# Patient Record
Sex: Male | Born: 1960 | Race: White | Hispanic: No | Marital: Married | Smoking: Current every day smoker
Health system: Southern US, Community
[De-identification: ages and names within clinical notes are randomized; demographics above are authoritative.]

## PROBLEM LIST (undated history)

## (undated) DIAGNOSIS — M1990 Unspecified osteoarthritis, unspecified site: Secondary | ICD-10-CM

## (undated) DIAGNOSIS — L409 Psoriasis, unspecified: Secondary | ICD-10-CM

## (undated) DIAGNOSIS — K08119 Complete loss of teeth due to trauma, unspecified class: Secondary | ICD-10-CM

## (undated) DIAGNOSIS — E222 Syndrome of inappropriate secretion of antidiuretic hormone: Secondary | ICD-10-CM

## (undated) DIAGNOSIS — T754XXA Electrocution, initial encounter: Secondary | ICD-10-CM

## (undated) HISTORY — PX: BACK SURGERY: SHX140

---

## 2000-05-04 ENCOUNTER — Emergency Department (HOSPITAL_COMMUNITY): Admission: EM | Admit: 2000-05-04 | Discharge: 2000-05-04 | Payer: Self-pay | Admitting: Emergency Medicine

## 2000-05-04 ENCOUNTER — Encounter: Payer: Self-pay | Admitting: Emergency Medicine

## 2000-10-22 ENCOUNTER — Emergency Department (HOSPITAL_COMMUNITY): Admission: EM | Admit: 2000-10-22 | Discharge: 2000-10-22 | Payer: Self-pay | Admitting: Emergency Medicine

## 2000-10-22 ENCOUNTER — Encounter: Payer: Self-pay | Admitting: Emergency Medicine

## 2000-11-03 ENCOUNTER — Emergency Department (HOSPITAL_COMMUNITY): Admission: EM | Admit: 2000-11-03 | Discharge: 2000-11-03 | Payer: Self-pay | Admitting: Emergency Medicine

## 2001-08-27 ENCOUNTER — Emergency Department (HOSPITAL_COMMUNITY): Admission: EM | Admit: 2001-08-27 | Discharge: 2001-08-28 | Payer: Self-pay | Admitting: Emergency Medicine

## 2003-06-25 ENCOUNTER — Emergency Department (HOSPITAL_COMMUNITY): Admission: EM | Admit: 2003-06-25 | Discharge: 2003-06-25 | Payer: Self-pay | Admitting: Emergency Medicine

## 2003-10-25 ENCOUNTER — Emergency Department (HOSPITAL_COMMUNITY): Admission: EM | Admit: 2003-10-25 | Discharge: 2003-10-25 | Payer: Self-pay | Admitting: *Deleted

## 2004-07-04 ENCOUNTER — Emergency Department (HOSPITAL_COMMUNITY): Admission: EM | Admit: 2004-07-04 | Discharge: 2004-07-04 | Payer: Self-pay | Admitting: Emergency Medicine

## 2004-10-15 ENCOUNTER — Emergency Department (HOSPITAL_COMMUNITY): Admission: EM | Admit: 2004-10-15 | Discharge: 2004-10-15 | Payer: Self-pay | Admitting: Emergency Medicine

## 2004-10-16 ENCOUNTER — Emergency Department (HOSPITAL_COMMUNITY): Admission: EM | Admit: 2004-10-16 | Discharge: 2004-10-17 | Payer: Self-pay | Admitting: Emergency Medicine

## 2004-10-25 ENCOUNTER — Emergency Department (HOSPITAL_COMMUNITY): Admission: EM | Admit: 2004-10-25 | Discharge: 2004-10-25 | Payer: Self-pay | Admitting: Emergency Medicine

## 2005-02-05 ENCOUNTER — Emergency Department (HOSPITAL_COMMUNITY): Admission: EM | Admit: 2005-02-05 | Discharge: 2005-02-05 | Payer: Self-pay | Admitting: Emergency Medicine

## 2008-08-12 ENCOUNTER — Ambulatory Visit (HOSPITAL_COMMUNITY): Admission: RE | Admit: 2008-08-12 | Discharge: 2008-08-12 | Payer: Self-pay | Admitting: Family Medicine

## 2010-02-10 ENCOUNTER — Encounter: Payer: Self-pay | Admitting: Orthopaedic Surgery

## 2010-10-16 ENCOUNTER — Other Ambulatory Visit: Payer: Self-pay

## 2010-10-16 ENCOUNTER — Telehealth: Payer: Self-pay

## 2010-10-16 DIAGNOSIS — Z139 Encounter for screening, unspecified: Secondary | ICD-10-CM

## 2010-10-16 NOTE — Telephone Encounter (Signed)
Rx and instructions mailed to pt.  

## 2010-10-16 NOTE — Telephone Encounter (Signed)
OK for colonoscopy.  

## 2010-10-16 NOTE — Telephone Encounter (Signed)
Gastroenterology Pre-Procedure Form  Request Date: 10/15/2010       Requesting Physician: Dr. Delbert Harness     PATIENT INFORMATION:  Colin Ellis is a 50 y.o., male (DOB=[birth year]).  PROCEDURE: Procedure(s) requested: colonoscopy Procedure Reason: screening for colon cancer  PATIENT REVIEW QUESTIONS: The patient reports the following:   1. Diabetes Melitis: no 2. Joint replacements in the past 12 months: no 3. Major health problems in the past 3 months: no 4. Has an artificial valve or MVP:no 5. Has been advised in past to take antibiotics in advance of a procedure like teeth cleaning: no}    MEDICATIONS & ALLERGIES:    Patient reports the following regarding taking any blood thinners:   Plavix? no Aspirin?no Coumadin?  no  Patient confirms/reports the following medications:  Current Outpatient Prescriptions  Medication Sig Dispense Refill  . amphetamine-dextroamphetamine (ADDERALL) 20 MG tablet Take 20 mg by mouth daily. 1/2 tablet three times daily       . naproxen sodium (ANAPROX) 220 MG tablet Take 220 mg by mouth 2 (two) times daily with a meal.        . UNKNOWN TO PATIENT Ointment for psoriasis as directed         Patient confirms/reports the following allergies:  Allergies  Allergen Reactions  . Sulfa Antibiotics     Upset stomach    Patient is appropriate to schedule for requested procedure(s): Yes  AUTHORIZATION INFORMATION Primary Insurance:   ID #:   Group #:  Pre-Cert / Auth required:  Pre-Cert / Auth #:   Secondary Insurance:   ID #:   Group #:  Pre-Cert / Auth required:  Pre-Cert / Auth #:   No orders of the defined types were placed in this encounter.    SCHEDULE INFORMATION: Procedure has been scheduled as follows:  Date: 11/12/2010     Time: 7:30 AM  Location: [facility] Short Stay   This Gastroenterology Pre-Precedure Form is being routed to the following provider(s) for review: R. Roetta Sessions, MD

## 2010-11-09 MED ORDER — SODIUM CHLORIDE 0.45 % IV SOLN
Freq: Once | INTRAVENOUS | Status: AC
  Administered 2010-11-12: 07:00:00 via INTRAVENOUS

## 2010-11-12 ENCOUNTER — Encounter (HOSPITAL_COMMUNITY)
Admission: RE | Disposition: A | Discharge status: Home / Self Care | Payer: Self-pay | Source: Ambulatory Visit | Attending: Internal Medicine

## 2010-11-12 ENCOUNTER — Encounter (HOSPITAL_COMMUNITY): Payer: Self-pay

## 2010-11-12 ENCOUNTER — Ambulatory Visit (HOSPITAL_COMMUNITY)
Admission: RE | Admit: 2010-11-12 | Discharge: 2010-11-12 | Disposition: A | Discharge status: Home / Self Care | Payer: BC Managed Care – PPO | Source: Ambulatory Visit | Attending: Internal Medicine | Admitting: Internal Medicine

## 2010-11-12 DIAGNOSIS — K5730 Diverticulosis of large intestine without perforation or abscess without bleeding: Secondary | ICD-10-CM | POA: Insufficient documentation

## 2010-11-12 DIAGNOSIS — Z1211 Encounter for screening for malignant neoplasm of colon: Secondary | ICD-10-CM

## 2010-11-12 DIAGNOSIS — Z139 Encounter for screening, unspecified: Secondary | ICD-10-CM

## 2010-11-12 HISTORY — DX: Syndrome of inappropriate secretion of antidiuretic hormone: E22.2

## 2010-11-12 HISTORY — PX: COLONOSCOPY: SHX5424

## 2010-11-12 SURGERY — COLONOSCOPY
Anesthesia: Moderate Sedation

## 2010-11-12 MED ORDER — STERILE WATER FOR IRRIGATION IR SOLN
Status: DC | PRN
  Administered 2010-11-12: 08:00:00

## 2010-11-12 MED ORDER — MEPERIDINE HCL 100 MG/ML IJ SOLN
INTRAMUSCULAR | Status: AC
  Filled 2010-11-12: qty 2

## 2010-11-12 MED ORDER — MEPERIDINE HCL 100 MG/ML IJ SOLN
INTRAMUSCULAR | Status: DC | PRN
  Administered 2010-11-12: 50 mg
  Administered 2010-11-12 (×3): 25 mg

## 2010-11-12 MED ORDER — MIDAZOLAM HCL 5 MG/5ML IJ SOLN
INTRAMUSCULAR | Status: DC | PRN
  Administered 2010-11-12 (×2): 2 mg via INTRAVENOUS
  Administered 2010-11-12 (×2): 1 mg via INTRAVENOUS

## 2010-11-12 MED ORDER — MIDAZOLAM HCL 5 MG/5ML IJ SOLN
INTRAMUSCULAR | Status: AC
  Filled 2010-11-12: qty 10

## 2010-11-12 NOTE — H&P (Signed)
  Primary Care Physician:  Isabella Stalling, MD Primary Gastroenterologist:  Dr.   Pre-Procedure History & Physical: HPI:  Colin Ellis is a 50 y.o. male is here for a screening colonoscopy. First-ever average risk screening examination. No bowel symptoms. No family history of colon cancer or colon polyps.  Past Medical History  Diagnosis Date  . ADH disorder     History reviewed. No pertinent past surgical history.  Prior to Admission medications   Medication Sig Start Date End Date Taking? Authorizing Provider  amphetamine-dextroamphetamine (ADDERALL) 20 MG tablet Take 10 mg by mouth 3 (three) times daily.    Yes Historical Provider, MD  halobetasol (ULTRAVATE) 0.05 % cream Apply 1 application topically daily. Psoriasis Outbreak    Yes Historical Provider, MD  naproxen sodium (ALEVE) 220 MG tablet Take 220 mg by mouth 2 (two) times daily as needed. Pain    Yes Historical Provider, MD  naproxen sodium (ANAPROX) 220 MG tablet Take 220 mg by mouth 2 (two) times daily with a meal.      Historical Provider, MD  UNKNOWN TO PATIENT Ointment for psoriasis as directed     Historical Provider, MD    Allergies as of 10/16/2010 - Review Complete 10/16/2010  Allergen Reaction Noted  . Sulfa antibiotics  10/16/2010    Family History  Problem Relation Age of Onset  . Anesthesia problems Neg Hx   . Hypotension Neg Hx   . Malignant hyperthermia Neg Hx   . Pseudochol deficiency Neg Hx     History   Social History  . Marital Status: Single    Spouse Name: N/A    Number of Children: N/A  . Years of Education: N/A   Occupational History  . Not on file.   Social History Main Topics  . Smoking status: Current Everyday Smoker -- 1.0 packs/day for 30 years    Types: Cigarettes  . Smokeless tobacco: Not on file  . Alcohol Use: 2.4 oz/week    4 Cans of beer per week  . Drug Use: No  . Sexually Active:    Other Topics Concern  . Not on file   Social History Narrative  . No  narrative on file    Review of Systems: See HPI, otherwise negative ROS  Physical Exam: BP 151/87  Pulse 63  Temp(Src) 98.6 F (37 C) (Oral)  Resp 12  Ht 6' (1.829 m)  Wt 160 lb (72.576 kg)  BMI 21.70 kg/m2  SpO2 100% General:   Alert,  Well-developed, well-nourished, pleasant and cooperative in NAD Head:  Normocephalic and atraumatic. Eyes:  Sclera clear, no icterus.   Conjunctiva pink. Nose:  No deformity, discharge,  or lesions. Mouth:  No deformity or lesions, dentition normal. Neck:  Supple; no masses or thyromegaly. Lungs:  Clear throughout to auscultation.   No wheezes, crackles, or rhonchi. No acute distress. Heart:  Regular rate and rhythm; no murmurs, clicks, rubs,  or gallops. Abdomen:  Soft, nontender and nondistended. No masses, hepatosplenomegaly or hernias noted. Normal bowel sounds, without guarding, and without rebound.   Msk:  Symmetrical without gross deformities. Normal posture. Pulses:  Normal pulses noted. Extremities:  Without clubbing or edema.  Impression/Plan: Colin Ellis is now here to undergo a screening colonoscopy.  First-ever average risk screening examination  Risks, benefits, limitations, imponderables and alternatives regarding colonoscopy have been reviewed with the patient. Questions have been answered. All parties agreeable.

## 2010-11-16 ENCOUNTER — Encounter (HOSPITAL_COMMUNITY): Payer: Self-pay | Admitting: Internal Medicine

## 2011-09-12 ENCOUNTER — Emergency Department (HOSPITAL_COMMUNITY)
Admission: EM | Admit: 2011-09-12 | Discharge: 2011-09-13 | Disposition: A | Discharge status: Home / Self Care | Payer: BC Managed Care – PPO | Attending: Emergency Medicine | Admitting: Emergency Medicine

## 2011-09-12 ENCOUNTER — Encounter (HOSPITAL_COMMUNITY): Payer: Self-pay | Admitting: *Deleted

## 2011-09-12 DIAGNOSIS — R209 Unspecified disturbances of skin sensation: Secondary | ICD-10-CM | POA: Insufficient documentation

## 2011-09-12 DIAGNOSIS — H538 Other visual disturbances: Secondary | ICD-10-CM | POA: Insufficient documentation

## 2011-09-12 DIAGNOSIS — Z79899 Other long term (current) drug therapy: Secondary | ICD-10-CM | POA: Insufficient documentation

## 2011-09-12 DIAGNOSIS — F17200 Nicotine dependence, unspecified, uncomplicated: Secondary | ICD-10-CM | POA: Insufficient documentation

## 2011-09-12 DIAGNOSIS — M542 Cervicalgia: Secondary | ICD-10-CM

## 2011-09-12 DIAGNOSIS — F988 Other specified behavioral and emotional disorders with onset usually occurring in childhood and adolescence: Secondary | ICD-10-CM | POA: Insufficient documentation

## 2011-09-12 NOTE — ED Provider Notes (Addendum)
History   This chart was scribed for Colin Lennert, MD by Sofie Rower. The patient was seen in room APA01/APA01 and the patient's care was started at 11:59 PM     CSN: [id number]  Arrival date & time 09/12/11  2244   First MD Initiated Contact with Patient 09/12/11 2355      Chief Complaint  Patient presents with  . Neck Pain  . Blurred Vision    (Consider location/radiation/quality/duration/timing/severity/associated sxs/prior treatment) Patient is a 51 y.o. male presenting with neck pain. The history is provided by the patient. No language interpreter was used.  Neck Pain  This is a new problem. The current episode started 1 to 2 hours ago. The problem occurs rarely. The problem has been gradually worsening. The pain is associated with an unknown factor. There has been no fever. The pain is present in the left side. The quality of the pain is described as aching. The pain is moderate. The symptoms are aggravated by position. The pain is the same all the time. Associated symptoms include visual change and numbness (left side of face.). Pertinent negatives include no chest pain, no headaches and no tingling. He has tried nothing for the symptoms. The treatment provided no relief.    Colin Ellis is a 51 y.o. male who presents to the Emergency Department complaining of sudden, progressively worsening, neck pain, located at the left side of the neck onset today (11:00PM) with associated symptoms of blurred vision and numbness located at the left side of the face. The pt describes the neck pain as a throbbing pain which comes and goes intermittently. Modifying factors include bending the neck into certain positions which intensifies the neck pain.  The pt is a current everyday smoker and drinks alcohol.   PCP is Dr. Janna Arch.    Past Medical History  Diagnosis Date  . ADH disorder     Past Surgical History  Procedure Date  . Colonoscopy 11/12/2010    Procedure: COLONOSCOPY;   Surgeon: Corbin Ade, MD;  Location: [facility];  Service: Endoscopy;  Laterality: N/A;  7:30 AM    Family History  Problem Relation Age of Onset  . Anesthesia problems Neg Hx   . Hypotension Neg Hx   . Malignant hyperthermia Neg Hx   . Pseudochol deficiency Neg Hx     History  Substance Use Topics  . Smoking status: Current Everyday Smoker -- 1.0 packs/day for 30 years    Types: Cigarettes  . Smokeless tobacco: Not on file  . Alcohol Use: 2.4 oz/week    4 Cans of beer per week      Review of Systems  Constitutional: Negative for fatigue.  HENT: Positive for neck pain. Negative for congestion, sinus pressure and ear discharge.   Eyes: Negative for discharge.  Respiratory: Negative for cough.   Cardiovascular: Negative for chest pain.  Gastrointestinal: Negative for abdominal pain and diarrhea.  Genitourinary: Negative for frequency and hematuria.  Musculoskeletal: Negative for back pain.  Skin: Negative for rash.  Neurological: Positive for numbness (left side of face.). Negative for tingling, seizures and headaches.  Hematological: Negative.   Psychiatric/Behavioral: Negative for hallucinations.    Allergies  Sulfa antibiotics  Home Medications   Current Outpatient Rx  Name Route Sig Dispense Refill  . AMPHETAMINE-DEXTROAMPHETAMINE 20 MG PO TABS Oral Take 10 mg by mouth 3 (three) times daily.     Marland Kitchen HALOBETASOL PROPIONATE 0.05 % EX CREA Topical Apply 1 application topically daily. Psoriasis  Outbreak     . NAPROXEN SODIUM 220 MG PO TABS Oral Take 660 mg by mouth daily. May take two additional tablets at bedtime if needed      BP 143/87  Pulse 93  Temp 98.4 F (36.9 C) (Oral)  Resp 20  Ht 6' (1.829 m)  Wt 145 lb (65.772 kg)  BMI 19.67 kg/m2  SpO2 97%  Physical Exam  Nursing note and vitals reviewed. Constitutional: He is oriented to person, place, and time. He appears well-developed.  HENT:  Head: Normocephalic and atraumatic.  Eyes: Conjunctivae  and EOM are normal. No scleral icterus.  Neck: Neck supple. No thyromegaly present.       Minor left lateral neck tenderness.   Cardiovascular: Normal rate and regular rhythm.  Exam reveals no gallop and no friction rub.   No murmur heard. Pulmonary/Chest: No stridor. He has no wheezes. He has no rales. He exhibits no tenderness.  Abdominal: He exhibits no distension. There is no tenderness. There is no rebound.  Musculoskeletal: Normal range of motion. He exhibits no edema.  Lymphadenopathy:    He has no cervical adenopathy.  Neurological: He is oriented to person, place, and time. Coordination normal.  Skin: No rash noted. No erythema.  Psychiatric: He has a normal mood and affect. His behavior is normal.    ED Course  Procedures (including critical care time)  DIAGNOSTIC STUDIES: Oxygen Saturation is 97% on room air , normal by my interpretation.    COORDINATION OF CARE:    12:01AM- X-ray discussed. Patient agrees with treatment.   Labs Reviewed - No data to display No results found.   No diagnosis found.    MDM       Date: 10/19/2011  Rate:76  Rhythm: normal sinus rhythm  QRS Axis: normal  Intervals: normal  ST/T Wave abnormalities: normal  Conduction Disutrbances:right bundle branch block  Narrative Interpretation:   Old EKG Reviewed: none available    The chart was scribed for me under my direct supervision.  I personally performed the history, physical, and medical decision making and all procedures in the evaluation of this patient.Colin Lennert, MD 09/13/11 1610  Colin Lennert, MD 10/19/11 1314

## 2011-09-12 NOTE — ED Notes (Signed)
Pt states sharp pain in side of L neck; states it feels like it is pulsating

## 2011-09-12 NOTE — ED Notes (Signed)
Pt with sudden left sided neck pain and c/o blurry vision to both eyes, left side of face "feels funny"

## 2011-09-13 ENCOUNTER — Emergency Department (HOSPITAL_COMMUNITY): Payer: BC Managed Care – PPO

## 2011-09-13 LAB — CBC WITH DIFFERENTIAL/PLATELET
Basophils Relative: 1 % (ref 0–1)
Eosinophils Absolute: 0.1 10*3/uL (ref 0.0–0.7)
Eosinophils Relative: 1 % (ref 0–5)
HCT: 40.8 % (ref 39.0–52.0)
Hemoglobin: 14.3 g/dL (ref 13.0–17.0)
Lymphs Abs: 1.1 10*3/uL (ref 0.7–4.0)
MCH: 33.3 pg (ref 26.0–34.0)
MCHC: 35 g/dL (ref 30.0–36.0)
MCV: 95.1 fL (ref 78.0–100.0)
Monocytes Absolute: 0.7 10*3/uL (ref 0.1–1.0)
Monocytes Relative: 11 % (ref 3–12)
Neutrophils Relative %: 71 % (ref 43–77)
RBC: 4.29 MIL/uL (ref 4.22–5.81)

## 2011-09-13 LAB — BASIC METABOLIC PANEL
BUN: 16 mg/dL (ref 6–23)
Calcium: 10.2 mg/dL (ref 8.4–10.5)
Creatinine, Ser: 0.89 mg/dL (ref 0.50–1.35)
GFR calc Af Amer: >90 mL/min (ref >90)
GFR calc non Af Amer: >90 mL/min (ref >90)
Glucose, Bld: 100 mg/dL — ABNORMAL HIGH (ref 70–99)

## 2011-09-13 MED ORDER — TRAMADOL HCL 50 MG PO TABS: 50.0000 mg | ORAL_TABLET | Freq: Four times a day (QID) | ORAL | Status: AC | PRN

## 2011-09-13 NOTE — ED Notes (Signed)
Pt stable at discharge with ambulatory gait; Pt Neuro assessment WNL. Pt instructed to follow up with MD if needed or to return back here if worsening sx. Pt instructed not to drive while on pain medication; Pt verbalizes understanding

## 2011-10-01 ENCOUNTER — Encounter (HOSPITAL_COMMUNITY): Payer: Self-pay | Admitting: Emergency Medicine

## 2011-10-01 ENCOUNTER — Emergency Department (HOSPITAL_COMMUNITY)
Admission: EM | Admit: 2011-10-01 | Discharge: 2011-10-02 | Disposition: A | Discharge status: Home / Self Care | Payer: BC Managed Care – PPO | Attending: Emergency Medicine | Admitting: Emergency Medicine

## 2011-10-01 DIAGNOSIS — S81009A Unspecified open wound, unspecified knee, initial encounter: Secondary | ICD-10-CM | POA: Insufficient documentation

## 2011-10-01 DIAGNOSIS — S8011XA Contusion of right lower leg, initial encounter: Secondary | ICD-10-CM

## 2011-10-01 DIAGNOSIS — F17200 Nicotine dependence, unspecified, uncomplicated: Secondary | ICD-10-CM | POA: Insufficient documentation

## 2011-10-01 DIAGNOSIS — S81811A Laceration without foreign body, right lower leg, initial encounter: Secondary | ICD-10-CM

## 2011-10-01 DIAGNOSIS — S91009A Unspecified open wound, unspecified ankle, initial encounter: Secondary | ICD-10-CM | POA: Insufficient documentation

## 2011-10-01 HISTORY — DX: Psoriasis, unspecified: L40.9

## 2011-10-01 MED ORDER — BACITRACIN-NEOMYCIN-POLYMYXIN 400-5-5000 EX OINT
TOPICAL_OINTMENT | Freq: Once | CUTANEOUS | Status: AC
  Administered 2011-10-01: 1 via TOPICAL
  Filled 2011-10-01 (×2): qty 1

## 2011-10-01 MED ORDER — TETANUS-DIPHTHERIA TOXOIDS TD 5-2 LFU IM INJ
INJECTION | INTRAMUSCULAR | Status: AC
  Administered 2011-10-01: 0.5 mL via INTRAMUSCULAR
  Filled 2011-10-01: qty 0.5

## 2011-10-01 MED ORDER — TETANUS-DIPHTHERIA TOXOIDS TD 5-2 LFU IM INJ
0.5000 mL | INJECTION | Freq: Once | INTRAMUSCULAR | Status: AC
  Administered 2011-10-01: 0.5 mL via INTRAMUSCULAR
  Filled 2011-10-01: qty 0.5

## 2011-10-01 NOTE — ED Notes (Signed)
Pt ambulatory leaving. Pt states he is going back home by self. Pt left with discharge instructions and verbalized understanding of instructions. Pt had no questions.
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
## 2011-10-03 NOTE — ED Provider Notes (Signed)
Medical screening examination/treatment/procedure(s) were performed by non-physician practitioner and as supervising physician I was immediately available for consultation/collaboration.  Schawn Byas, MD 10/03/11 0320 

## 2012-04-11 ENCOUNTER — Ambulatory Visit (HOSPITAL_COMMUNITY)
Admission: RE | Admit: 2012-04-11 | Discharge: 2012-04-11 | Disposition: A | Discharge status: Home / Self Care | Payer: BC Managed Care – PPO | Source: Ambulatory Visit | Attending: Family Medicine | Admitting: Family Medicine

## 2012-04-11 ENCOUNTER — Other Ambulatory Visit: Payer: Self-pay | Admitting: Family Medicine

## 2012-04-11 DIAGNOSIS — M545 Low back pain, unspecified: Secondary | ICD-10-CM

## 2012-04-11 DIAGNOSIS — M25559 Pain in unspecified hip: Secondary | ICD-10-CM | POA: Insufficient documentation

## 2016-06-12 ENCOUNTER — Other Ambulatory Visit (HOSPITAL_COMMUNITY): Payer: Self-pay | Admitting: Family Medicine

## 2016-06-12 ENCOUNTER — Ambulatory Visit (HOSPITAL_COMMUNITY)
Admission: RE | Admit: 2016-06-12 | Discharge: 2016-06-12 | Disposition: A | Discharge status: Home / Self Care | Payer: 59 | Source: Ambulatory Visit | Attending: Family Medicine | Admitting: Family Medicine

## 2016-06-12 DIAGNOSIS — M1990 Unspecified osteoarthritis, unspecified site: Secondary | ICD-10-CM

## 2016-06-12 DIAGNOSIS — M21941 Unspecified acquired deformity of hand, right hand: Secondary | ICD-10-CM | POA: Insufficient documentation

## 2017-04-09 ENCOUNTER — Other Ambulatory Visit (HOSPITAL_COMMUNITY): Payer: Self-pay | Admitting: Family Medicine

## 2017-04-09 ENCOUNTER — Ambulatory Visit (HOSPITAL_COMMUNITY)
Admission: RE | Admit: 2017-04-09 | Discharge: 2017-04-09 | Disposition: A | Discharge status: Home / Self Care | Payer: BLUE CROSS/BLUE SHIELD | Source: Ambulatory Visit | Attending: Family Medicine | Admitting: Family Medicine

## 2017-04-09 DIAGNOSIS — M4316 Spondylolisthesis, lumbar region: Secondary | ICD-10-CM | POA: Diagnosis not present

## 2017-04-09 DIAGNOSIS — G894 Chronic pain syndrome: Secondary | ICD-10-CM | POA: Insufficient documentation

## 2017-04-09 DIAGNOSIS — M545 Low back pain: Secondary | ICD-10-CM

## 2017-04-09 DIAGNOSIS — M48061 Spinal stenosis, lumbar region without neurogenic claudication: Secondary | ICD-10-CM | POA: Diagnosis not present

## 2017-04-10 DIAGNOSIS — M5416 Radiculopathy, lumbar region: Secondary | ICD-10-CM | POA: Diagnosis not present

## 2017-04-10 DIAGNOSIS — M5126 Other intervertebral disc displacement, lumbar region: Secondary | ICD-10-CM | POA: Diagnosis not present

## 2017-04-10 DIAGNOSIS — R030 Elevated blood-pressure reading, without diagnosis of hypertension: Secondary | ICD-10-CM | POA: Diagnosis not present

## 2017-04-22 DIAGNOSIS — M5126 Other intervertebral disc displacement, lumbar region: Secondary | ICD-10-CM | POA: Diagnosis not present

## 2017-04-29 ENCOUNTER — Other Ambulatory Visit: Payer: Self-pay | Admitting: Neurological Surgery

## 2017-04-29 DIAGNOSIS — M48061 Spinal stenosis, lumbar region without neurogenic claudication: Secondary | ICD-10-CM | POA: Diagnosis not present

## 2017-04-29 DIAGNOSIS — M5416 Radiculopathy, lumbar region: Secondary | ICD-10-CM | POA: Diagnosis not present

## 2017-04-29 DIAGNOSIS — Z681 Body mass index (BMI) 19 or less, adult: Secondary | ICD-10-CM | POA: Diagnosis not present

## 2017-04-29 DIAGNOSIS — M4316 Spondylolisthesis, lumbar region: Secondary | ICD-10-CM | POA: Diagnosis not present

## 2017-05-16 NOTE — Pre-Procedure Instructions (Signed)
    Colin SeltzerJohn J Ellis  05/16/2017      [facility] [id number] - [place], [place] - [address] [place] [id number] Phone: [phone] Fax: [phone]    Your procedure is scheduled on Wed., May 28, 2017  [facility] "A" at 6:30AM  Call this number if you have problems the morning of surgery:  [phone]   Remember:  Do not eat food or drink liquids after midnight.  Take these medicines the morning of surgery with A SIP OF WATER: If needed Oxycodone for pain.  7 days before surgery (5/1), stop taking all Aspirins, Vitamins, Fish oils, and Herbal medications. Also stop all NSAIDS i.e. Advil, Ibuprofen, Motrin, Aleve, Anaprox, Naproxen, BC and Goody Powders.    Do not wear jewelry.  Do not wear lotions, powders, colognes, or deodorant.  Do not shave 48 hours prior to surgery.  Men may shave face.  Do not bring valuables to the hospital.  [facility] is not responsible for any belongings or valuables.  Contacts, dentures or bridgework may not be worn into surgery.  Leave your suitcase in the car.  After surgery it may be brought to your room.  For patients admitted to the hospital, discharge time will be determined by your treatment team.  Patients discharged the day of surgery will not be allowed to drive home.   Special instructions:    Please read over the following fact sheets that you were given. Pain Booklet, Coughing and Deep Breathing, MRSA Information and Surgical Site Infection Prevention

## 2017-05-19 ENCOUNTER — Other Ambulatory Visit: Payer: Self-pay

## 2017-05-19 ENCOUNTER — Ambulatory Visit (HOSPITAL_COMMUNITY)
Admission: RE | Admit: 2017-05-19 | Discharge: 2017-05-19 | Disposition: A | Discharge status: Home / Self Care | Payer: BLUE CROSS/BLUE SHIELD | Source: Ambulatory Visit | Attending: Neurological Surgery | Admitting: Neurological Surgery

## 2017-05-19 ENCOUNTER — Encounter (HOSPITAL_COMMUNITY)
Admission: RE | Admit: 2017-05-19 | Discharge: 2017-05-19 | Disposition: A | Discharge status: Home / Self Care | Payer: BLUE CROSS/BLUE SHIELD | Source: Ambulatory Visit | Attending: Neurological Surgery | Admitting: Neurological Surgery

## 2017-05-19 ENCOUNTER — Encounter (HOSPITAL_COMMUNITY): Payer: Self-pay

## 2017-05-19 DIAGNOSIS — R001 Bradycardia, unspecified: Secondary | ICD-10-CM | POA: Diagnosis not present

## 2017-05-19 DIAGNOSIS — Z01812 Encounter for preprocedural laboratory examination: Secondary | ICD-10-CM | POA: Insufficient documentation

## 2017-05-19 DIAGNOSIS — M4310 Spondylolisthesis, site unspecified: Secondary | ICD-10-CM | POA: Insufficient documentation

## 2017-05-19 DIAGNOSIS — Z01810 Encounter for preprocedural cardiovascular examination: Secondary | ICD-10-CM | POA: Insufficient documentation

## 2017-05-19 DIAGNOSIS — Z01818 Encounter for other preprocedural examination: Secondary | ICD-10-CM | POA: Diagnosis not present

## 2017-05-19 HISTORY — DX: Electrocution, initial encounter: T75.4XXA

## 2017-05-19 HISTORY — DX: Complete loss of teeth due to trauma, unspecified class: K08.119

## 2017-05-19 HISTORY — DX: Unspecified osteoarthritis, unspecified site: M19.90

## 2017-05-19 LAB — CBC WITH DIFFERENTIAL/PLATELET
BASOS ABS: 0.1 10*3/uL (ref 0.0–0.1)
Basophils Relative: 1 %
Eosinophils Absolute: 0.2 10*3/uL (ref 0.0–0.7)
Eosinophils Relative: 3 %
HCT: 44.5 % (ref 39.0–52.0)
Hemoglobin: 15.1 g/dL (ref 13.0–17.0)
LYMPHS PCT: 29 %
Lymphs Abs: 2.1 10*3/uL (ref 0.7–4.0)
MCH: 32.5 pg (ref 26.0–34.0)
MCHC: 33.9 g/dL (ref 30.0–36.0)
MCV: 95.7 fL (ref 78.0–100.0)
Monocytes Absolute: 0.5 10*3/uL (ref 0.1–1.0)
Monocytes Relative: 7 %
NEUTROS PCT: 60 %
Neutro Abs: 4.4 10*3/uL (ref 1.7–7.7)
Platelets: 212 10*3/uL (ref 150–400)
RBC: 4.65 MIL/uL (ref 4.22–5.81)
RDW: 12.5 % (ref 11.5–15.5)
WBC: 7.2 10*3/uL (ref 4.0–10.5)

## 2017-05-19 LAB — BASIC METABOLIC PANEL
ANION GAP: 10 (ref 5–15)
BUN: 14 mg/dL (ref 6–20)
CO2: 27 mmol/L (ref 22–32)
Calcium: 9.2 mg/dL (ref 8.9–10.3)
Chloride: 105 mmol/L (ref 101–111)
Creatinine, Ser: 0.86 mg/dL (ref 0.61–1.24)
GFR calc Af Amer: >60 mL/min (ref >60)
Glucose, Bld: 98 mg/dL (ref 65–99)
POTASSIUM: 4.1 mmol/L (ref 3.5–5.1)
SODIUM: 142 mmol/L (ref 135–145)

## 2017-05-19 LAB — TYPE AND SCREEN
ABO/RH(D): B POS
ANTIBODY SCREEN: NEGATIVE

## 2017-05-19 LAB — SURGICAL PCR SCREEN
MRSA, PCR: NEGATIVE
Staphylococcus aureus: POSITIVE — AB

## 2017-05-19 LAB — ABO/RH: ABO/RH(D): B POS

## 2017-05-19 LAB — PROTIME-INR
INR: 1
Prothrombin Time: 13.1 seconds (ref 11.4–15.2)

## 2017-05-19 NOTE — Pre-Procedure Instructions (Signed)
Tyjuan Demetro Medero  05/19/2017      [id number] - [place], [place] - [address] [place] [address] Phone: [phone] Fax: [phone]    Your procedure is scheduled on Wed., May 28, 2017  Report to [facility] Admitting Entrance "A" at 6:30AM  Call this number if you have problems the morning of surgery:  [phone]   Remember:  Do not eat food or drink liquids after midnight.   Take these medicines the morning of surgery with A SIP OF WATER: If needed Oxycodone for pain.  7 days before surgery (5/1), stop taking all Aspirins, Vitamins, Fish oils, and Herbal medications. Also stop all NSAIDS i.e. Advil, Ibuprofen, Motrin, Aleve, Anaprox, Naproxen, BC and Goody Powders.    Do not wear jewelry.  Do not wear lotions, powders, colognes, or deodorant.  Do not shave 48 hours prior to surgery.  Men may shave face.  Do not bring valuables to the hospital.  [facility] is not responsible for any belongings or valuables.  Contacts, dentures or bridgework may not be worn into surgery.  Leave your suitcase in the car.  After surgery it may be brought to your room.  For patients admitted to the hospital, discharge time will be determined by your treatment team.  Patients discharged the day of surgery will not be allowed to drive home.   Special instructions:  [facility]- Preparing For Surgery  Before surgery, you can play an important role. Because skin is not sterile, your skin needs to be as free of germs as possible. You can reduce the number of germs on your skin by washing with CHG (chlorahexidine gluconate) Soap before surgery.  CHG is an antiseptic cleaner which kills germs and bonds with the skin to continue killing germs even after washing.  Please do not use if you have an allergy to CHG or antibacterial soaps. If your skin becomes reddened/irritated stop using the CHG.  Do not shave  (including legs and underarms) for at least 48 hours prior to first CHG shower. It is OK to shave your face.  Please follow these instructions carefully.   1. Shower the NIGHT BEFORE SURGERY and the MORNING OF SURGERY with CHG.   2. If you chose to wash your hair, wash your hair first as usual with your normal shampoo.  3. After you shampoo, rinse your hair and body thoroughly to remove the shampoo.  4. Use CHG as you would any other liquid soap. You can apply CHG directly to the skin and wash gently with a scrungie or a clean washcloth.   5. Apply the CHG Soap to your body ONLY FROM THE NECK DOWN.  Do not use on open wounds or open sores. Avoid contact with your eyes, ears, mouth and genitals (private parts). Wash Face and genitals (private parts)  with your normal soap.  6. Wash thoroughly, paying special attention to the area where your surgery will be performed.  7. Thoroughly rinse your body with warm water from the neck down.  8. DO NOT shower/wash with your normal soap after using and rinsing off the CHG Soap.  9. Pat yourself dry with a CLEAN TOWEL.  10. Wear CLEAN PAJAMAS to bed the night before surgery, wear comfortable clothes the morning of surgery  11. Place CLEAN SHEETS on your bed the night of your first shower and DO NOT  SLEEP WITH PETS.    Day of Surgery: Do not apply any deodorants/lotions. Please wear clean clothes to the hospital/surgery center.       Please read over the following fact sheets that you were given. Pain Booklet, Coughing and Deep Breathing, MRSA Information and Surgical Site Infection Prevention

## 2017-05-19 NOTE — Progress Notes (Signed)
PCP: Dr. Janna Arch Cardiologist: Denies  EKG: Today CXR: Today ECHO: denies Stress Test: denies Cardiac Cath: denies  Instructed no smoking 24 hours prior to procedure.  Patient denies shortness of breath, fever, cough, and chest pain at PAT appointment.  Patient verbalized understanding of instructions provided today at the PAT appointment.  Patient asked to review instructions at home and day of surgery.

## 2017-05-27 ENCOUNTER — Encounter (HOSPITAL_COMMUNITY): Payer: Self-pay | Admitting: Anesthesiology

## 2017-05-27 NOTE — Anesthesia Preprocedure Evaluation (Addendum)
Anesthesia Evaluation  Patient identified by MRN, date of birth, ID band Patient awake    Reviewed: Allergy & Precautions, NPO status , Patient's Chart, lab work & pertinent test results  Airway Mallampati: I  TM Distance: >3 FB Neck ROM: Full    Dental  (+) Teeth Intact, Dental Advisory Given, Poor Dentition, Chipped,    Pulmonary Current Smoker,    breath sounds clear to auscultation       Cardiovascular negative cardio ROS   Rhythm:Regular Rate:Normal     Neuro/Psych negative neurological ROS  negative psych ROS   GI/Hepatic negative GI ROS, Neg liver ROS,   Endo/Other  negative endocrine ROS  Renal/GU negative Renal ROS     Musculoskeletal  (+) Arthritis ,   Abdominal Normal abdominal exam  (+)   Peds negative pediatric ROS (+)  Hematology negative hematology ROS (+)   Anesthesia Other Findings   Reproductive/Obstetrics                           Lab Results  Component Value Date   WBC 7.2 05/19/2017   HGB 15.1 05/19/2017   HCT 44.5 05/19/2017   MCV 95.7 05/19/2017   PLT 212 05/19/2017   Lab Results  Component Value Date   CREATININE 0.86 05/19/2017   BUN 14 05/19/2017   NA 142 05/19/2017   K 4.1 05/19/2017   CL 105 05/19/2017   CO2 27 05/19/2017   Lab Results  Component Value Date   INR 1.00 05/19/2017   EKG: Sinus Bradycardia  Anesthesia Physical Anesthesia Plan  ASA: II  Anesthesia Plan: General   Post-op Pain Management:    Induction: Intravenous  PONV Risk Score and Plan: 2 and Ondansetron and Dexamethasone  Airway Management Planned: Oral ETT  Additional Equipment:   Intra-op Plan:   Post-operative Plan: Extubation in OR  Informed Consent:   Plan Discussed with:   Anesthesia Plan Comments:         Anesthesia Quick Evaluation

## 2017-05-28 ENCOUNTER — Encounter (HOSPITAL_COMMUNITY)
Admission: RE | Disposition: A | Discharge status: Home / Self Care | Payer: Self-pay | Source: Ambulatory Visit | Attending: Neurological Surgery

## 2017-05-28 ENCOUNTER — Inpatient Hospital Stay (HOSPITAL_COMMUNITY): Payer: BLUE CROSS/BLUE SHIELD | Admitting: Anesthesiology

## 2017-05-28 ENCOUNTER — Encounter (HOSPITAL_COMMUNITY): Payer: Self-pay | Admitting: *Deleted

## 2017-05-28 ENCOUNTER — Inpatient Hospital Stay (HOSPITAL_COMMUNITY): Payer: BLUE CROSS/BLUE SHIELD

## 2017-05-28 ENCOUNTER — Inpatient Hospital Stay (HOSPITAL_COMMUNITY)
Admission: RE | Admit: 2017-05-28 | Discharge: 2017-05-29 | DRG: 455 | Disposition: A | Discharge status: Home / Self Care | Payer: BLUE CROSS/BLUE SHIELD | Source: Ambulatory Visit | Attending: Neurological Surgery | Admitting: Neurological Surgery

## 2017-05-28 DIAGNOSIS — Z882 Allergy status to sulfonamides status: Secondary | ICD-10-CM

## 2017-05-28 DIAGNOSIS — Z981 Arthrodesis status: Secondary | ICD-10-CM

## 2017-05-28 DIAGNOSIS — M4326 Fusion of spine, lumbar region: Secondary | ICD-10-CM | POA: Diagnosis not present

## 2017-05-28 DIAGNOSIS — Z419 Encounter for procedure for purposes other than remedying health state, unspecified: Secondary | ICD-10-CM

## 2017-05-28 DIAGNOSIS — M545 Low back pain: Secondary | ICD-10-CM | POA: Diagnosis not present

## 2017-05-28 DIAGNOSIS — L409 Psoriasis, unspecified: Secondary | ICD-10-CM | POA: Diagnosis present

## 2017-05-28 DIAGNOSIS — F17210 Nicotine dependence, cigarettes, uncomplicated: Secondary | ICD-10-CM | POA: Diagnosis not present

## 2017-05-28 DIAGNOSIS — M4316 Spondylolisthesis, lumbar region: Secondary | ICD-10-CM | POA: Diagnosis not present

## 2017-05-28 DIAGNOSIS — M48061 Spinal stenosis, lumbar region without neurogenic claudication: Secondary | ICD-10-CM | POA: Diagnosis not present

## 2017-05-28 SURGERY — POSTERIOR LUMBAR FUSION 1 LEVEL
Anesthesia: General | Site: Back

## 2017-05-28 MED ORDER — METHOCARBAMOL 500 MG PO TABS
500.0000 mg | ORAL_TABLET | Freq: Four times a day (QID) | ORAL | Status: DC | PRN
  Administered 2017-05-28 – 2017-05-29 (×3): 500 mg via ORAL
  Filled 2017-05-28 (×3): qty 1

## 2017-05-28 MED ORDER — HYDROMORPHONE HCL 2 MG/ML IJ SOLN
0.3000 mg | INTRAMUSCULAR | Status: DC | PRN
  Administered 2017-05-28 (×4): 0.5 mg via INTRAVENOUS

## 2017-05-28 MED ORDER — SUGAMMADEX SODIUM 200 MG/2ML IV SOLN
INTRAVENOUS | Status: AC
  Filled 2017-05-28: qty 2

## 2017-05-28 MED ORDER — OXYCODONE HCL 5 MG PO TABS
10.0000 mg | ORAL_TABLET | ORAL | Status: DC | PRN
  Administered 2017-05-28 – 2017-05-29 (×6): 10 mg via ORAL
  Filled 2017-05-28 (×6): qty 2

## 2017-05-28 MED ORDER — LIDOCAINE 2% (20 MG/ML) 5 ML SYRINGE
INTRAMUSCULAR | Status: AC
  Filled 2017-05-28: qty 5

## 2017-05-28 MED ORDER — SUGAMMADEX SODIUM 200 MG/2ML IV SOLN
INTRAVENOUS | Status: DC | PRN
  Administered 2017-05-28: 200 mg via INTRAVENOUS

## 2017-05-28 MED ORDER — POTASSIUM CHLORIDE IN NACL 20-0.9 MEQ/L-% IV SOLN: INTRAVENOUS | Status: DC

## 2017-05-28 MED ORDER — DEXAMETHASONE SODIUM PHOSPHATE 10 MG/ML IJ SOLN
INTRAMUSCULAR | Status: AC
Start: 2017-05-28 — End: ?
  Filled 2017-05-28: qty 1

## 2017-05-28 MED ORDER — PHENYLEPHRINE 40 MCG/ML (10ML) SYRINGE FOR IV PUSH (FOR BLOOD PRESSURE SUPPORT)
PREFILLED_SYRINGE | INTRAVENOUS | Status: AC
  Filled 2017-05-28: qty 10

## 2017-05-28 MED ORDER — CEFAZOLIN SODIUM-DEXTROSE 2-4 GM/100ML-% IV SOLN
2.0000 g | INTRAVENOUS | Status: AC
  Administered 2017-05-28 (×2): 2 g via INTRAVENOUS
  Filled 2017-05-28: qty 100

## 2017-05-28 MED ORDER — PROMETHAZINE HCL 25 MG/ML IJ SOLN: 6.2500 mg | INTRAMUSCULAR | Status: DC | PRN

## 2017-05-28 MED ORDER — MIDAZOLAM HCL 2 MG/2ML IJ SOLN
INTRAMUSCULAR | Status: AC
  Filled 2017-05-28: qty 2

## 2017-05-28 MED ORDER — LIDOCAINE HCL (CARDIAC) PF 100 MG/5ML IV SOSY
PREFILLED_SYRINGE | INTRAVENOUS | Status: DC | PRN
  Administered 2017-05-28: 30 mg via INTRAVENOUS

## 2017-05-28 MED ORDER — METHOCARBAMOL 1000 MG/10ML IJ SOLN
500.0000 mg | Freq: Four times a day (QID) | INTRAVENOUS | Status: DC | PRN
  Filled 2017-05-28: qty 5

## 2017-05-28 MED ORDER — SODIUM CHLORIDE 0.9% FLUSH
3.0000 mL | Freq: Two times a day (BID) | INTRAVENOUS | Status: DC
  Administered 2017-05-28: 3 mL via INTRAVENOUS

## 2017-05-28 MED ORDER — CHLORHEXIDINE GLUCONATE CLOTH 2 % EX PADS: 6.0000 | MEDICATED_PAD | Freq: Once | CUTANEOUS | Status: DC

## 2017-05-28 MED ORDER — ACETAMINOPHEN 650 MG RE SUPP: 650.0000 mg | RECTAL | Status: DC | PRN

## 2017-05-28 MED ORDER — SUFENTANIL CITRATE 50 MCG/ML IV SOLN
INTRAVENOUS | Status: DC | PRN
  Administered 2017-05-28: 5 ug via INTRAVENOUS
  Administered 2017-05-28: 25 ug via INTRAVENOUS
  Administered 2017-05-28: 10 ug via INTRAVENOUS
  Administered 2017-05-28: 5 ug via INTRAVENOUS

## 2017-05-28 MED ORDER — DEXMEDETOMIDINE HCL 200 MCG/2ML IV SOLN
INTRAVENOUS | Status: DC | PRN
  Administered 2017-05-28: 67.5 ug via INTRAVENOUS

## 2017-05-28 MED ORDER — EPHEDRINE SULFATE 50 MG/ML IJ SOLN
INTRAMUSCULAR | Status: DC | PRN
  Administered 2017-05-28: 10 mg via INTRAVENOUS

## 2017-05-28 MED ORDER — CEFAZOLIN SODIUM-DEXTROSE 2-4 GM/100ML-% IV SOLN
2.0000 g | Freq: Three times a day (TID) | INTRAVENOUS | Status: AC
  Administered 2017-05-28 (×2): 2 g via INTRAVENOUS
  Filled 2017-05-28 (×2): qty 100

## 2017-05-28 MED ORDER — ONDANSETRON HCL 4 MG/2ML IJ SOLN
INTRAMUSCULAR | Status: DC | PRN
  Administered 2017-05-28: 4 mg via INTRAVENOUS

## 2017-05-28 MED ORDER — MIDAZOLAM HCL 5 MG/5ML IJ SOLN
INTRAMUSCULAR | Status: DC | PRN
  Administered 2017-05-28: 2 mg via INTRAVENOUS

## 2017-05-28 MED ORDER — SENNA 8.6 MG PO TABS
1.0000 | ORAL_TABLET | Freq: Two times a day (BID) | ORAL | Status: DC
  Administered 2017-05-28 – 2017-05-29 (×2): 8.6 mg via ORAL
  Filled 2017-05-28 (×2): qty 1

## 2017-05-28 MED ORDER — DEXMEDETOMIDINE HCL IN NACL 200 MCG/50ML IV SOLN
INTRAVENOUS | Status: AC
  Filled 2017-05-28: qty 50

## 2017-05-28 MED ORDER — 0.9 % SODIUM CHLORIDE (POUR BTL) OPTIME
TOPICAL | Status: DC | PRN
  Administered 2017-05-28: 1000 mL

## 2017-05-28 MED ORDER — THROMBIN (RECOMBINANT) 5000 UNITS EX SOLR
OROMUCOSAL | Status: DC | PRN
  Administered 2017-05-28: 12:00:00 via TOPICAL

## 2017-05-28 MED ORDER — LACTATED RINGERS IV SOLN
INTRAVENOUS | Status: DC | PRN
  Administered 2017-05-28 (×3): via INTRAVENOUS

## 2017-05-28 MED ORDER — BUPIVACAINE HCL (PF) 0.25 % IJ SOLN
INTRAMUSCULAR | Status: DC | PRN
  Administered 2017-05-28: 3 mL

## 2017-05-28 MED ORDER — THROMBIN 5000 UNITS EX SOLR
CUTANEOUS | Status: AC
  Filled 2017-05-28: qty 10000

## 2017-05-28 MED ORDER — SUFENTANIL CITRATE 50 MCG/ML IV SOLN
INTRAVENOUS | Status: AC
  Filled 2017-05-28: qty 1

## 2017-05-28 MED ORDER — DEXAMETHASONE SODIUM PHOSPHATE 10 MG/ML IJ SOLN
10.0000 mg | INTRAMUSCULAR | Status: AC
  Administered 2017-05-28: 10 mg via INTRAVENOUS
  Filled 2017-05-28: qty 1

## 2017-05-28 MED ORDER — PROPOFOL 10 MG/ML IV BOLUS
INTRAVENOUS | Status: AC
  Filled 2017-05-28: qty 40

## 2017-05-28 MED ORDER — ONDANSETRON HCL 4 MG PO TABS: 4.0000 mg | ORAL_TABLET | Freq: Four times a day (QID) | ORAL | Status: DC | PRN

## 2017-05-28 MED ORDER — ONDANSETRON HCL 4 MG/2ML IJ SOLN: 4.0000 mg | Freq: Four times a day (QID) | INTRAMUSCULAR | Status: DC | PRN

## 2017-05-28 MED ORDER — SODIUM CHLORIDE 0.9% FLUSH: 3.0000 mL | INTRAVENOUS | Status: DC | PRN

## 2017-05-28 MED ORDER — ACETAMINOPHEN 325 MG PO TABS
650.0000 mg | ORAL_TABLET | ORAL | Status: DC | PRN
  Administered 2017-05-28 – 2017-05-29 (×2): 650 mg via ORAL
  Filled 2017-05-28 (×2): qty 2

## 2017-05-28 MED ORDER — SODIUM CHLORIDE 0.9 % IV SOLN
INTRAVENOUS | Status: DC | PRN
  Administered 2017-05-28: 12:00:00

## 2017-05-28 MED ORDER — HYDROMORPHONE HCL 1 MG/ML IJ SOLN
0.5000 mg | INTRAMUSCULAR | Status: DC | PRN
  Administered 2017-05-29: 0.5 mg via INTRAVENOUS
  Filled 2017-05-28: qty 0.5

## 2017-05-28 MED ORDER — MEPERIDINE HCL 50 MG/ML IJ SOLN: 6.2500 mg | INTRAMUSCULAR | Status: DC | PRN

## 2017-05-28 MED ORDER — MENTHOL 3 MG MT LOZG: 1.0000 | LOZENGE | OROMUCOSAL | Status: DC | PRN

## 2017-05-28 MED ORDER — HYDROMORPHONE HCL 2 MG/ML IJ SOLN
INTRAMUSCULAR | Status: AC
  Administered 2017-05-28: 0.5 mg via INTRAVENOUS
  Filled 2017-05-28: qty 1

## 2017-05-28 MED ORDER — ROCURONIUM BROMIDE 100 MG/10ML IV SOLN
INTRAVENOUS | Status: DC | PRN
  Administered 2017-05-28: 10 mg via INTRAVENOUS
  Administered 2017-05-28: 20 mg via INTRAVENOUS
  Administered 2017-05-28: 10 mg via INTRAVENOUS
  Administered 2017-05-28: 50 mg via INTRAVENOUS
  Administered 2017-05-28: 10 mg via INTRAVENOUS

## 2017-05-28 MED ORDER — SODIUM CHLORIDE 0.9 % IJ SOLN
INTRAMUSCULAR | Status: AC
  Filled 2017-05-28: qty 20

## 2017-05-28 MED ORDER — VANCOMYCIN HCL 1000 MG IV SOLR
INTRAVENOUS | Status: DC | PRN
  Administered 2017-05-28: 1000 mg via TOPICAL

## 2017-05-28 MED ORDER — VANCOMYCIN HCL 1000 MG IV SOLR
INTRAVENOUS | Status: AC
  Filled 2017-05-28: qty 1000

## 2017-05-28 MED ORDER — PHENOL 1.4 % MT LIQD: 1.0000 | OROMUCOSAL | Status: DC | PRN

## 2017-05-28 MED ORDER — LACTATED RINGERS IV SOLN
INTRAVENOUS | Status: DC
  Administered 2017-05-28: 08:00:00 via INTRAVENOUS

## 2017-05-28 MED ORDER — LACTATED RINGERS IV SOLN: INTRAVENOUS | Status: DC

## 2017-05-28 MED ORDER — THROMBIN (RECOMBINANT) 20000 UNITS EX SOLR
CUTANEOUS | Status: DC | PRN
  Administered 2017-05-28: 12:00:00 via TOPICAL

## 2017-05-28 MED ORDER — ONDANSETRON HCL 4 MG/2ML IJ SOLN
INTRAMUSCULAR | Status: AC
  Filled 2017-05-28: qty 2

## 2017-05-28 MED ORDER — BUPIVACAINE HCL (PF) 0.25 % IJ SOLN
INTRAMUSCULAR | Status: AC
  Filled 2017-05-28: qty 30

## 2017-05-28 MED ORDER — ROCURONIUM BROMIDE 50 MG/5ML IV SOLN
INTRAVENOUS | Status: AC
  Filled 2017-05-28: qty 1

## 2017-05-28 MED ORDER — THROMBIN 20000 UNITS EX SOLR
CUTANEOUS | Status: AC
  Filled 2017-05-28: qty 20000

## 2017-05-28 MED ORDER — PROPOFOL 10 MG/ML IV BOLUS
INTRAVENOUS | Status: DC | PRN
  Administered 2017-05-28: 200 mg via INTRAVENOUS

## 2017-05-28 SURGICAL SUPPLY — 67 items
ADH SKN CLS APL DERMABOND .7 (GAUZE/BANDAGES/DRESSINGS) ×1
APL SKNCLS STERI-STRIP NONHPOA (GAUZE/BANDAGES/DRESSINGS) ×1
BAG DECANTER FOR FLEXI CONT (MISCELLANEOUS) ×2 IMPLANT
BASKET BONE COLLECTION (BASKET) ×2 IMPLANT
BENZOIN TINCTURE PRP APPL 2/3 (GAUZE/BANDAGES/DRESSINGS) ×2 IMPLANT
BLADE CLIPPER SURG (BLADE) IMPLANT
BUR MATCHSTICK NEURO 3.0 LAGG (BURR) ×2 IMPLANT
CANISTER SUCT 3000ML PPV (MISCELLANEOUS) ×2 IMPLANT
CARTRIDGE OIL MAESTRO DRILL (MISCELLANEOUS) ×1 IMPLANT
CONT SPEC 4OZ CLIKSEAL STRL BL (MISCELLANEOUS) ×2 IMPLANT
COVER BACK TABLE 60X90IN (DRAPES) ×2 IMPLANT
DERMABOND ADVANCED (GAUZE/BANDAGES/DRESSINGS) ×1
DERMABOND ADVANCED .7 DNX12 (GAUZE/BANDAGES/DRESSINGS) ×1 IMPLANT
DIFFUSER DRILL AIR PNEUMATIC (MISCELLANEOUS) ×2 IMPLANT
DRAPE C-ARM 42X72 X-RAY (DRAPES) ×3 IMPLANT
DRAPE C-ARMOR (DRAPES) ×1 IMPLANT
DRAPE LAPAROTOMY 100X72X124 (DRAPES) ×2 IMPLANT
DRAPE POUCH INSTRU U-SHP 10X18 (DRAPES) ×1 IMPLANT
DRAPE SURG 17X23 STRL (DRAPES) ×2 IMPLANT
DRSG OPSITE POSTOP 4X6 (GAUZE/BANDAGES/DRESSINGS) ×1 IMPLANT
DURAPREP 26ML APPLICATOR (WOUND CARE) ×2 IMPLANT
ELECT REM PT RETURN 9FT ADLT (ELECTROSURGICAL) ×2
ELECTRODE REM PT RTRN 9FT ADLT (ELECTROSURGICAL) ×1 IMPLANT
EVACUATOR 1/8 PVC DRAIN (DRAIN) ×1 IMPLANT
GAUZE SPONGE 4X4 16PLY XRAY LF (GAUZE/BANDAGES/DRESSINGS) IMPLANT
GLOVE BIO SURGEON STRL SZ7 (GLOVE) ×1 IMPLANT
GLOVE BIO SURGEON STRL SZ8 (GLOVE) ×4 IMPLANT
GLOVE BIOGEL PI IND STRL 6.5 (GLOVE) IMPLANT
GLOVE BIOGEL PI IND STRL 7.0 (GLOVE) IMPLANT
GLOVE BIOGEL PI IND STRL 7.5 (GLOVE) IMPLANT
GLOVE BIOGEL PI INDICATOR 6.5 (GLOVE) ×2
GLOVE BIOGEL PI INDICATOR 7.0 (GLOVE) ×1
GLOVE BIOGEL PI INDICATOR 7.5 (GLOVE) ×2
GLOVE SS BIOGEL STRL SZ 7 (GLOVE) IMPLANT
GLOVE SUPERSENSE BIOGEL SZ 7 (GLOVE) ×1
GLOVE SURG SS PI 6.5 STRL IVOR (GLOVE) ×6 IMPLANT
GOWN STRL REUS W/ TWL LRG LVL3 (GOWN DISPOSABLE) IMPLANT
GOWN STRL REUS W/ TWL XL LVL3 (GOWN DISPOSABLE) ×2 IMPLANT
GOWN STRL REUS W/TWL 2XL LVL3 (GOWN DISPOSABLE) IMPLANT
GOWN STRL REUS W/TWL LRG LVL3 (GOWN DISPOSABLE) ×8
GOWN STRL REUS W/TWL XL LVL3 (GOWN DISPOSABLE) ×4
HEMOSTAT POWDER KIT SURGIFOAM (HEMOSTASIS) ×1 IMPLANT
KIT BASIN OR (CUSTOM PROCEDURE TRAY) ×2 IMPLANT
KIT TURNOVER KIT B (KITS) ×2 IMPLANT
MILL MEDIUM DISP (BLADE) ×2 IMPLANT
NDL HYPO 25X1 1.5 SAFETY (NEEDLE) ×1 IMPLANT
NEEDLE HYPO 25X1 1.5 SAFETY (NEEDLE) ×2 IMPLANT
NS IRRIG 1000ML POUR BTL (IV SOLUTION) ×2 IMPLANT
OIL CARTRIDGE MAESTRO DRILL (MISCELLANEOUS) ×2
PACK LAMINECTOMY NEURO (CUSTOM PROCEDURE TRAY) ×2 IMPLANT
PAD ARMBOARD 7.5X6 YLW CONV (MISCELLANEOUS) ×6 IMPLANT
PUTTY DBM 5CC ×1 IMPLANT
ROD PC 5.5X40 TI ARSENAL (Rod) ×2 IMPLANT
SCREW CORT CANC 5.5X40 (Screw) ×4 IMPLANT
SCREW SET SPINAL ARSENAL 47127 (Screw) ×4 IMPLANT
SPACER IDENTITI PS 9X9X25 10D (Spacer) ×2 IMPLANT
SPONGE LAP 4X18 X RAY DECT (DISPOSABLE) IMPLANT
SPONGE SURGIFOAM ABS GEL 100 (HEMOSTASIS) ×2 IMPLANT
STRIP CLOSURE SKIN 1/2X4 (GAUZE/BANDAGES/DRESSINGS) ×3 IMPLANT
SUT VIC AB 0 CT1 18XCR BRD8 (SUTURE) ×1 IMPLANT
SUT VIC AB 0 CT1 8-18 (SUTURE) ×2
SUT VIC AB 2-0 CP2 18 (SUTURE) ×2 IMPLANT
SUT VIC AB 3-0 SH 8-18 (SUTURE) ×4 IMPLANT
TOWEL GREEN STERILE (TOWEL DISPOSABLE) ×2 IMPLANT
TOWEL GREEN STERILE FF (TOWEL DISPOSABLE) ×2 IMPLANT
TRAY FOLEY MTR SLVR 16FR STAT (SET/KITS/TRAYS/PACK) ×2 IMPLANT
WATER STERILE IRR 1000ML POUR (IV SOLUTION) ×2 IMPLANT

## 2017-05-28 NOTE — Anesthesia Procedure Notes (Signed)
Procedure Name: Intubation Date/Time: 05/28/2017 10:56 AM Performed by: Eligha Bridegroom, CRNA Pre-anesthesia Checklist: Patient identified, Emergency Drugs available, Suction available, Patient being monitored and Timeout performed Patient Re-evaluated:Patient Re-evaluated prior to induction Oxygen Delivery Method: Circle system utilized Preoxygenation: Pre-oxygenation with 100% oxygen Induction Type: IV induction Laryngoscope Size: Mac and 4 Tube type: Oral Tube size: 7.5 mm Number of attempts: 1 Airway Equipment and Method: Stylet Secured at: 23 cm Tube secured with: Tape Dental Injury: Teeth and Oropharynx as per pre-operative assessment
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
## 2017-05-28 NOTE — Op Note (Signed)
05/28/2017  2:11 PM  PATIENT:  Colin Ellis  57 y.o. male  PRE-OPERATIVE DIAGNOSIS:  Lumbar spinal stenosis L1-2 L2 3, spondylolisthesis L2-3, back and right leg pain  POST-OPERATIVE DIAGNOSIS:  same  PROCEDURE:   1. Decompressive lumbar laminectomy L1-2, L2 3 requiring more work than would be required for a simple exposure of the disk for PLIF in order to adequately decompress the neural elements and address the spinal stenosis, note the L1-2 level was separate from the plif level 2. Posterior lumbar interbody fusion L2-3 using porous titanium interbody cages packed with morcellized allograft and autograft 3. Posterior fixation L2-3 using cortical pedicle screws.  4. Intertransverse arthrodesis L2-3 using morcellized autograft and allograft.  SURGEON:  Marikay Alar, MD  ASSISTANTS: Dr. Lovell Sheehan  ANESTHESIA:  General  EBL: 1:30 ml  Total I/O In: 2300 [I.V.:2300] Out: 490 [Urine:360; Blood:130]  BLOOD ADMINISTERED:none  DRAINS: none   INDICATION FOR PROCEDURE: This patient presented with severe back and right leg pain. Imaging revealed spondylolisthesis with severe stenosis L2-3 and spinal stenosis at L1-2. The patient tried a reasonable attempt at conservative medical measures without relief. I recommended decompression and instrumented fusion to address the stenosis as well as the segmental  instability.  Patient understood the risks, benefits, and alternatives and potential outcomes and wished to proceed.  PROCEDURE DETAILS:  The patient was brought to the operating room. After induction of generalized endotracheal anesthesia the patient was rolled into the prone position on chest rolls and all pressure points were padded. The patient's lumbar region was cleaned and then prepped with DuraPrep and draped in the usual sterile fashion. Anesthesia was injected and then a dorsal midline incision was made and carried down to the lumbosacral fascia. The fascia was opened and the  paraspinous musculature was taken down in a subperiosteal fashion to expose L1-2 and L2-3. A self-retaining retractor was placed. Intraoperative fluoroscopy confirmed my level, and I started with placement of the L2 cortical pedicle screws. The pedicle screw entry zones were identified utilizing surface landmarks and  AP and lateral fluoroscopy. I scored the cortex with the high-speed drill and then used the hand drill to drill an upward and outward direction into the pedicle. I then tapped line to line. I then placed a 5.5 x 40 mm cortical pedicle screw into the pedicles of L2 bilaterally. I then turned my attention to the decompression and complete lumbar laminectomies, hemi- facetectomies, and foraminotomies were performed at L2-3. The patient had significant spinal stenosis and this required more work than would be required for a simple exposure of the disc for posterior lumbar interbody fusion which would only require a limited laminotomy. Much more generous decompression and generous foraminotomy was undertaken in order to adequately decompress the neural elements and address the patient's leg pain. The yellow ligament was removed to expose the underlying dura and nerve roots, and generous foraminotomies were performed to adequately decompress the neural elements. Both the exiting and traversing nerve roots were decompressed on both sides until a coronary dilator passed easily along the nerve roots. I also performed a left hemilaminectomy medial facetectomy and foraminotomy at L1-2 and then performed a sublaminar decompression at this level formation noted compression of the right lateral recess decompression from the left-hand side. Once the decompression was complete, I turned my attention to the posterior lower lumbar interbody fusion. The epidural venous vasculature was coagulated and cut sharply. Disc space was incised and the initial discectomy was performed with pituitary rongeurs. The disc space  was  distracted with sequential distractors to a height of 10 mm. We then used a series of scrapers and shavers to prepare the endplates for fusion. The midline was prepared with Epstein curettes. Once the complete discectomy was finished, we packed an appropriate sized interbody cage with local autograft and morcellized allograft, gently retracted the nerve root, and tapped the cage into position at L2-3.  The midline between the cages was packed with morselized autograft and allograft. We then turned our attention to the placement of the lower pedicle screws. The pedicle screw entry zones were identified utilizing surface landmarks and fluoroscopy. I drilled into each pedicle utilizing the hand drill, and tapped each pedicle with the appropriate tap. We palpated with a ball probe to assure no break in the cortex. We then placed 5.5 x 40 mm cortical pedicle screws into the pedicles bilaterally at L3. We then decorticated the transverse processes and laid a mixture of morcellized autograft and allograft out over these to perform intertransverse arthrodesis at L2-3. We then placed lordotic rods into the multiaxial screw heads of the pedicle screws and locked these in position with the locking caps and anti-torque device. We then checked our construct with AP and lateral fluoroscopy. Irrigated with copious amounts of bacitracin-containing saline solution. Inspected the nerve roots once again to assure adequate decompression, lined to the dura with Gelfoam, placed powdered vancomycin into the wound, and closed the muscle and the fascia with 0 Vicryl. Closed the subcutaneous tissues with 2-0 Vicryl and subcuticular tissues with 3-0 Vicryl. The skin was closed with benzoin and Steri-Strips. Dressing was then applied, the patient was awakened from general anesthesia and transported to the recovery room in stable condition. At the end of the procedure all sponge, needle and instrument counts were correct.   PLAN OF CARE:  admit to inpatient  PATIENT DISPOSITION:  PACU - hemodynamically stable.   Delay start of Pharmacological VTE agent (>24hrs) due to surgical blood loss or risk of bleeding:  yes

## 2017-05-28 NOTE — Transfer of Care (Signed)
Immediate Anesthesia Transfer of Care Note  Patient: Aidyn Kellis Kleinert  Procedure(s) Performed: Posterior Lumbar Interbody Fusion Lumbar two-three, decompression Lumbar one-two (N/A Back)  Patient Location: PACU  Anesthesia Type:General  Level of Consciousness: awake and alert   Airway & Oxygen Therapy: Patient Spontanous Breathing and Patient connected to nasal cannula oxygen  Post-op Assessment: Report given to RN and Post -op Vital signs reviewed and stable  Post vital signs: Reviewed and stable  Last Vitals:  Vitals Value Taken Time  BP 148/93 05/28/2017  2:28 PM  Temp 36.5 C 05/28/2017  2:28 PM  Pulse 55 05/28/2017  2:32 PM  Resp 13 05/28/2017  2:32 PM  SpO2 93 % 05/28/2017  2:32 PM  Vitals shown include unvalidated device data.  Last Pain:  Vitals:   05/28/17 1428  TempSrc:   PainSc: (P) Asleep         Complications: No apparent anesthesia complications

## 2017-05-28 NOTE — Anesthesia Postprocedure Evaluation (Signed)
Anesthesia Post Note  Patient: Colin Ellis  Procedure(s) Performed: Posterior Lumbar Interbody Fusion Lumbar two-three, decompression Lumbar one-two (N/A Back)     Patient location during evaluation: PACU Anesthesia Type: General Level of consciousness: awake and alert Pain management: pain level controlled Vital Signs Assessment: post-procedure vital signs reviewed and stable Respiratory status: spontaneous breathing, nonlabored ventilation, respiratory function stable and patient connected to nasal cannula oxygen Cardiovascular status: blood pressure returned to baseline and stable Postop Assessment: no apparent nausea or vomiting Anesthetic complications: no    Last Vitals:  Vitals:   05/28/17 1530 05/28/17 1547  BP: (!) 142/77 (!) 143/90  Pulse: (!) 58 73  Resp: 14 20  Temp: 36.6 C   SpO2: 100% 100%    Last Pain:  Vitals:   05/28/17 1526  TempSrc:   PainSc: 3                  Shelton Silvas

## 2017-05-29 MED ORDER — HYDROCODONE-ACETAMINOPHEN 5-325 MG PO TABS: 1.0000 | ORAL_TABLET | ORAL | 0 refills | Status: AC | PRN

## 2017-05-29 MED ORDER — METHOCARBAMOL 500 MG PO TABS: 500.0000 mg | ORAL_TABLET | Freq: Four times a day (QID) | ORAL | 0 refills | Status: DC | PRN

## 2017-05-29 MED FILL — Thrombin For Soln 5000 Unit: CUTANEOUS | Qty: 5000 | Status: AC

## 2017-05-29 MED FILL — Thrombin For Soln 20000 Unit: CUTANEOUS | Qty: 1 | Status: AC

## 2017-05-29 NOTE — Evaluation (Signed)
Physical Therapy Evaluation and Discharge Patient Details Name: Colin Ellis MRN: [id number] DOB: 1960/03/04 Today's Date: 05/29/2017   History of Present Illness  Pt is a 57 y/o male who presents s/p L1-L3 PLIF on 05/28/17. No significant PMH noted.   Clinical Impression  Patient evaluated by Physical Therapy with no further acute PT needs identified. All education has been completed and the patient has no further questions. At the time of PT eval pt was able to perform transfers and ambulation with gross modified independence. Pt was educated on precautions, brace application/wearing schedule, car transfer, and safe activity progression at home. See below for any follow-up Physical Therapy or equipment needs. PT is signing off. Thank you for this referral.     Follow Up Recommendations No PT follow up;Supervision - Intermittent    Equipment Recommendations  None recommended by PT    Recommendations for Other Services       Precautions / Restrictions Precautions Precautions: Fall;Back Precaution Booklet Issued: Yes (comment) Precaution Comments: Handout provided and reviewed in detail. Pt was cued for precautions during functional mobility.  Required Braces or Orthoses: Spinal Brace Spinal Brace: Lumbar corset;Applied in sitting position Restrictions Weight Bearing Restrictions: No      Mobility  Bed Mobility Overal bed mobility: Modified Independent             General bed mobility comments: Increased time. VC's for log roll technique.   Transfers Overall transfer level: Modified independent Equipment used: None Transfers: Sit to/from Stand           General transfer comment: Pt was able to demonstrate sit<>stand without assistance and without any unsteadiness noted.   Ambulation/Gait Ambulation/Gait assistance: Modified independent (Device/Increase time) Ambulation Distance (Feet): 400 Feet Assistive device: None Gait Pattern/deviations: Step-through  pattern;Decreased stride length Gait velocity: Decreased Gait velocity interpretation: 1.31 - 2.62 ft/sec, indicative of limited community ambulator General Gait Details: Slow but overall steady. No unsteadiness or LOB noted, however appeared guarded due to pain initially.   Stairs Stairs: Yes Stairs assistance: Modified independent (Device/Increase time) Stair Management: Alternating pattern;Forwards;One rail Right Number of Stairs: 10 General stair comments: Pt was able to negotiate stairs without difficulty. VC's for safety.   Wheelchair Mobility    Modified Rankin (Stroke Patients Only)       Balance Overall balance assessment: Mild deficits observed, not formally tested                                           Pertinent Vitals/Pain Pain Assessment: Faces Faces Pain Scale: Hurts little more Pain Location: Incision site Pain Descriptors / Indicators: Operative site guarding Pain Intervention(s): Limited activity within patient's tolerance;Monitored during session;Repositioned    Home Living Family/patient expects to be discharged to:: Private residence Living Arrangements: Spouse/significant other Available Help at Discharge: Family;Available 24 hours/day Type of Home: House Home Access: Stairs to enter   [employer] of Steps: 3 Home Layout: Two level Home Equipment: Walker - 2 wheels;Bedside commode;Shower seat Additional Comments: Above information is for pt's home however he is planning to stay with his parents for a short time where he will not have to negotiate any stairs.     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  Upper Extremity Assessment Upper Extremity Assessment: Overall WFL for tasks assessed    Lower Extremity Assessment Lower Extremity Assessment: Overall WFL for tasks assessed    Cervical  / Trunk Assessment Cervical / Trunk Assessment: Other exceptions Cervical / Trunk Exceptions: s/p surgery  Communication   Communication: No difficulties  Cognition Arousal/Alertness: Awake/alert Behavior During Therapy: WFL for tasks assessed/performed Overall Cognitive Status: Within Functional Limits for tasks assessed                                        General Comments      Exercises     Assessment/Plan    PT Assessment Patent does not need any further PT services  PT Problem List         PT Treatment Interventions      PT Goals (Current goals can be found in the Care Plan section)  Acute Rehab PT Goals Patient Stated Goal: Home today PT Goal Formulation: All assessment and education complete, DC therapy    Frequency     Barriers to discharge        Co-evaluation               AM-PAC PT "6 Clicks" Daily Activity  Outcome Measure Difficulty turning over in bed (including adjusting bedclothes, sheets and blankets)?: None Difficulty moving from lying on back to sitting on the side of the bed? : None Difficulty sitting down on and standing up from a chair with arms (e.g., wheelchair, bedside commode, etc,.)?: None Help needed moving to and from a bed to chair (including a wheelchair)?: None Help needed walking in hospital room?: None Help needed climbing 3-5 steps with a railing? : A Little 6 Click Score: 23    End of Session Equipment Utilized During Treatment: Back brace;Gait belt Activity Tolerance: Patient tolerated treatment well Patient left: with call bell/phone within reach(Sitting EOB) Nurse Communication: Mobility status PT Visit Diagnosis: Pain;Other symptoms and signs involving the nervous system (R29.898) Pain - part of body: (incision site)    Time: 1610-9604 PT Time Calculation (min) (ACUTE ONLY): 14 min   Charges:   PT Evaluation $PT Eval Low Complexity: 1 Low     PT G Codes:        Conni Slipper, PT,  DPT Acute Rehabilitation Services Pager: [phone]   Marylynn Pearson 05/29/2017, 9:33 AM

## 2017-05-29 NOTE — Progress Notes (Signed)
Pt doing well. Pt given D/C instructions with Rx's, verbal understanding was provided. Pt's incision is clean and dry with no sign of infection. Pt's IV was removed prior to D/C. Pt D/C'd home via walking per MD order. Pt is stable @ D/C and has no other needs at this time. Lasandra Batley, RN  

## 2017-05-29 NOTE — Discharge Summary (Signed)
Physician Discharge Summary  Patient ID: Colin Ellis MRN: [id number] DOB/AGE: 58-14-1962 58 y.o.  Admit date: 05/28/2017 Discharge date: 05/29/2017  Admission Diagnoses: Lumbar spinal stenosis L1-2 L2 3, spondylolisthesis L2-3, back and right leg pain   Discharge Diagnoses: same   Discharged Condition: good  Hospital Course: The patient was admitted on 05/28/2017 and taken to the operating room where the patient underwent plif l2-3, decompression l1-2. The patient tolerated the procedure well and was taken to the recovery room and then to the floor in stable condition. The hospital course was routine. There were no complications. The wound remained clean dry and intact. Pt had appropriate back soreness. No complaints of leg pain or new N/T/W. The patient remained afebrile with stable vital signs, and tolerated a regular diet. The patient continued to increase activities, and pain was well controlled with oral pain medications.   Consults: None  Significant Diagnostic Studies:  Results for orders placed or performed during the hospital encounter of 05/19/17  ABO/Rh  Result Value Ref Range   ABO/RH(D)      B POS Performed at [facility], [address], [place], [place] [address]     Chest 2 View  Result Date: 05/20/2017 CLINICAL DATA:  Preoperative study for lumbar spine surgery. Current smoker. EXAM: CHEST - 2 VIEW COMPARISON:  Chest x-ray dated July 04, 2004. FINDINGS: The heart size and mediastinal contours are within normal limits. Both lungs are clear. The visualized skeletal structures are unremarkable. IMPRESSION: No active cardiopulmonary disease. Electronically Signed   By: Obie Dredge M.D.   On: 05/20/2017 07:58   Dg Lumbar Spine 2-3 Views  Result Date: 05/28/2017 CLINICAL DATA:  Posterior surgical fusion of L2-3. EXAM: DG C-ARM 61-120 MIN; LUMBAR SPINE - 2-3 VIEW FLUOROSCOPY TIME:  1 minutes 10 seconds. COMPARISON:  MRI of April 09, 2017. FINDINGS: Status post  surgical posterior fusion of L2-3 with bilateral intrapedicular screw placement and interbody fusion. Good alignment of vertebral bodies is noted. IMPRESSION: Status post surgical posterior fusion of L2-3. Electronically Signed   By: Lupita Raider, M.D.   On: 05/28/2017 14:37   Dg C-arm 1-60 Min  Result Date: 05/28/2017 CLINICAL DATA:  Posterior surgical fusion of L2-3. EXAM: DG C-ARM 61-120 MIN; LUMBAR SPINE - 2-3 VIEW FLUOROSCOPY TIME:  1 minutes 10 seconds. COMPARISON:  MRI of April 09, 2017. FINDINGS: Status post surgical posterior fusion of L2-3 with bilateral intrapedicular screw placement and interbody fusion. Good alignment of vertebral bodies is noted. IMPRESSION: Status post surgical posterior fusion of L2-3. Electronically Signed   By: Lupita Raider, M.D.   On: 05/28/2017 14:37    Antibiotics:  Anti-infectives (From admission, onward)   Start     Dose/Rate Route Frequency Ordered Stop   05/28/17 1600  ceFAZolin (ANCEF) IVPB 2g/100 mL premix     2 g 200 mL/hr over 30 Minutes Intravenous Every 8 hours 05/28/17 1547 05/29/17 0100   05/28/17 1134  vancomycin (VANCOCIN) powder  Status:  Discontinued       As needed 05/28/17 1134 05/28/17 1424   05/28/17 1132  bacitracin 50,000 Units in sodium chloride 0.9 % 500 mL irrigation  Status:  Discontinued       As needed 05/28/17 1132 05/28/17 1424   05/28/17 0725  ceFAZolin (ANCEF) IVPB 2g/100 mL premix     2 g 200 mL/hr over 30 Minutes Intravenous On call to O.R. 05/28/17 0725 05/28/17 1100      Discharge Exam: Blood pressure Marland Kitchen)  100/51, pulse 65, temperature 98.9 F (37.2 C), temperature source Oral, resp. rate 18, height  (1.803 m), weight 148 lb 14 oz (67.5 kg), SpO2 100 %. Neurologic: Grossly normal Ambulating and voiding well  Discharge Medications:   Allergies as of 05/29/2017      Reactions   Sulfa Antibiotics Nausea And Vomiting   Upset stomach      Medication List    TAKE these medications    amphetamine-dextroamphetamine 30 MG tablet Commonly known as:  ADDERALL Take 15 mg by mouth 3 (three) times daily as needed. For focus with work   HYDROcodone-acetaminophen 5-325 MG tablet Commonly known as:  NORCO/VICODIN Take 1 tablet by mouth every 4 (four) hours as needed for moderate pain.   LORazepam 1 MG tablet Commonly known as:  ATIVAN Take 1 mg by mouth at bedtime.   methocarbamol 500 MG tablet Commonly known as:  ROBAXIN Take 1 tablet (500 mg total) by mouth every 6 (six) hours as needed for muscle spasms.   [id number] PSORIASIS MEDICATED EX Apply 1 application topically 2 (two) times daily.   naproxen sodium 220 MG tablet Commonly known as:  ALEVE Take 220-440 mg by mouth 3 (three) times daily as needed (for pain.).   Oxycodone HCl 10 MG Tabs Take 10 mg by mouth 2 (two) times daily as needed (for pain.).            Durable Medical Equipment  (From admission, onward)        Start     Ordered   05/28/17 1548  DME Walker rolling  Once    Question:  Patient needs a walker to treat with the following condition  Answer:  S/P lumbar fusion   05/28/17 1547   05/28/17 1548  DME 3 n 1  Once     05/28/17 1547      Disposition: home   Final Dx: plif L2-3 and decompression L1-2  Discharge Instructions     Remove dressing in 72 hours   Complete by:  As directed    Call MD for:  difficulty breathing, headache or visual disturbances   Complete by:  As directed    Call MD for:  extreme fatigue   Complete by:  As directed    Call MD for:  hives   Complete by:  As directed    Call MD for:  persistant dizziness or light-headedness   Complete by:  As directed    Call MD for:  persistant nausea and vomiting   Complete by:  As directed    Call MD for:  redness, tenderness, or signs of infection (pain, swelling, redness, odor or green/yellow discharge around incision site)   Complete by:  As directed    Call MD for:  severe uncontrolled pain   Complete by:  As  directed    Call MD for:  temperature >100.4   Complete by:  As directed    Diet - low sodium heart healthy   Complete by:  As directed    Driving Restrictions   Complete by:  As directed    No driving 2 weeks   Increase activity slowly   Complete by:  As directed    Lifting restrictions   Complete by:  As directed    No lifting more than 8 bls         Signed: Tiana Loft Ellery Tash 05/29/2017, 6:53 AM

## 2017-05-29 NOTE — Evaluation (Signed)
Occupational Therapy Evaluation and Discharge Patient Details Name: Colin Ellis MRN: [id number] DOB: 1960/10/12 Today's Date: 05/29/2017    History of Present Illness Pt is a 57 y/o male who presents s/p L1-L3 PLIF on 05/28/17. No significant PMH noted.    Clinical Impression   Pt reports he was independent with ADL PTA. Currently pt supervision-mod I with ADL and functional mobility. All back, safety, and ADL education completed with pt. Pt planning to d/c home with 24/7 supervision from family. No further acute OT needs identified; signing off at this time. Please re-consult if needs change. Thank you for this referral.    Follow Up Recommendations  No OT follow up;Supervision - Intermittent    Equipment Recommendations  None recommended by OT    Recommendations for Other Services       Precautions / Restrictions Precautions Precautions: Back Precaution Booklet Issued: No Precaution Comments: Pt able to recall 3/3 back precautions Required Braces or Orthoses: Spinal Brace Spinal Brace: Lumbar corset;Applied in sitting position Restrictions Weight Bearing Restrictions: No      Mobility Bed Mobility              General bed mobility comments: Pt sitting EOB upon arrival  Transfers Overall transfer level: Modified independent Equipment used: None Transfers: Sit to/from Stand               Balance Overall balance assessment: No apparent balance deficits (not formally assessed)                                         ADL either performed or assessed with clinical judgement   ADL Overall ADL's : Needs assistance/impaired Eating/Feeding: Independent;Sitting   Grooming: Supervision/safety;Standing Grooming Details (indicate cue type and reason): Educated on use of 2 cups for oral care Upper Body Bathing: Set up   Lower Body Bathing: Set up;Supervison/ safety;Sit to/from stand   Upper Body Dressing : Set up;Standing Upper Body  Dressing Details (indicate cue type and reason): Pt familiar with brace management and wear scheudle Lower Body Dressing: Set up;Supervision/safety;Sit to/from stand Lower Body Dressing Details (indicate cue type and reason): Educated on compensatory strategies for LB ADL; pt able to return demo technique when donning underwear, socks, and pants Toilet Transfer: Supervision/safety;Ambulation;Regular Teacher, adult education Details (indicate cue type and reason): Simulated in room   Toileting - Clothing Manipulation Details (indicate cue type and reason): Educated on proper technique for peri care without twisting Tub/ Shower Transfer: Supervision/safety;Tub transfer;Ambulation Tub/Shower Transfer Details (indicate cue type and reason): Simulated in room. Discussed safety with bathing and transfers Functional mobility during ADLs: Supervision/safety General ADL Comments: Educated pt on log roll technique for bed mobility, maintaining back precautions during functional activities, keeping frequently used items at counter top height     Vision         Perception     Praxis      Pertinent Vitals/Pain Pain Assessment: Faces Faces Pain Scale: Hurts a little bit Pain Location: Incision site Pain Descriptors / Indicators: Discomfort Pain Intervention(s): Monitored during session;Limited activity within patient's tolerance     Hand Dominance     Extremity/Trunk Assessment Upper Extremity Assessment Upper Extremity Assessment: Overall WFL for tasks assessed   Lower Extremity Assessment Lower Extremity Assessment: Overall WFL for tasks assessed   Cervical / Trunk Assessment Cervical / Trunk Assessment: Other exceptions Cervical / Trunk Exceptions: s/p spine surgery  Communication Communication Communication: No difficulties   Cognition Arousal/Alertness: Awake/alert Behavior During Therapy: WFL for tasks assessed/performed Overall Cognitive Status: Within Functional Limits for  tasks assessed                                     General Comments       Exercises     Shoulder Instructions      Home Living Family/patient expects to be discharged to:: Private residence Living Arrangements: Spouse/significant other Available Help at Discharge: Family;Available 24 hours/day Type of Home: House Home Access: Stairs to enter [employer] of Steps: 3   Home Layout: Two level;Bed/bath upstairs Alternate Level Stairs-Number of Steps: 16   Bathroom Shower/Tub: Chief Strategy Officer: Handicapped height     Home Equipment: Environmental consultant - 2 wheels;Bedside commode;Shower seat   Additional Comments: Above information is for pt's home however he is planning to stay with his parents for a short time where he will not have to negotiate any stairs.       Prior Functioning/Environment Level of Independence: Independent        Comments: Works full time as an Geophysical data processor Problem List:        OT Treatment/Interventions:      OT Goals(Current goals can be found in the care plan section) Acute Rehab OT Goals Patient Stated Goal: Home today OT Goal Formulation: All assessment and education complete, DC therapy  OT Frequency:     Barriers to D/C:            Co-evaluation              AM-PAC PT "6 Clicks" Daily Activity     Outcome Measure Help from another person eating meals?: None Help from another person taking care of personal grooming?: A Little Help from another person toileting, which includes using toliet, bedpan, or urinal?: A Little Help from another person bathing (including washing, rinsing, drying)?: A Little Help from another person to put on and taking off regular upper body clothing?: A Little Help from another person to put on and taking off regular lower body clothing?: A Little 6 Click Score: 19   End of Session Equipment Utilized During Treatment: Back brace Nurse Communication:  Mobility status;Other (comment)(no equipment or f/u needs)  Activity Tolerance: Patient tolerated treatment well Patient left: Other (comment);with call bell/phone within reach(sitting EOB)  OT Visit Diagnosis: Pain Pain - part of body: (back)                Time: 0811-0822 OT Time Calculation (min): 11 min Charges:  OT General Charges $OT Visit: 1 Visit OT Evaluation $OT Eval Low Complexity: 1 Low G-Codes:     Khyree Carillo A. Brett Albino, M.S., OTR/L Pager: [phone]  Gaye Alken 05/29/2017, 9:56 AM

## 2017-05-29 NOTE — Discharge Instructions (Signed)

## 2017-07-22 DIAGNOSIS — M4316 Spondylolisthesis, lumbar region: Secondary | ICD-10-CM | POA: Diagnosis not present

## 2018-01-02 DIAGNOSIS — J449 Chronic obstructive pulmonary disease, unspecified: Secondary | ICD-10-CM | POA: Diagnosis not present

## 2018-01-02 DIAGNOSIS — N529 Male erectile dysfunction, unspecified: Secondary | ICD-10-CM | POA: Diagnosis not present

## 2018-01-02 DIAGNOSIS — M545 Low back pain: Secondary | ICD-10-CM | POA: Diagnosis not present

## 2018-01-02 DIAGNOSIS — G4700 Insomnia, unspecified: Secondary | ICD-10-CM | POA: Diagnosis not present

## 2018-03-20 DIAGNOSIS — D510 Vitamin B12 deficiency anemia due to intrinsic factor deficiency: Secondary | ICD-10-CM | POA: Diagnosis not present

## 2018-03-20 DIAGNOSIS — I110 Hypertensive heart disease with heart failure: Secondary | ICD-10-CM | POA: Diagnosis not present

## 2018-03-20 DIAGNOSIS — R5383 Other fatigue: Secondary | ICD-10-CM | POA: Diagnosis not present

## 2018-03-20 DIAGNOSIS — E559 Vitamin D deficiency, unspecified: Secondary | ICD-10-CM | POA: Diagnosis not present

## 2018-03-20 DIAGNOSIS — E7849 Other hyperlipidemia: Secondary | ICD-10-CM | POA: Diagnosis not present

## 2018-03-27 DIAGNOSIS — M545 Low back pain: Secondary | ICD-10-CM | POA: Diagnosis not present

## 2018-06-26 DIAGNOSIS — E559 Vitamin D deficiency, unspecified: Secondary | ICD-10-CM | POA: Diagnosis not present

## 2018-06-26 DIAGNOSIS — M542 Cervicalgia: Secondary | ICD-10-CM | POA: Diagnosis not present

## 2018-06-26 DIAGNOSIS — J449 Chronic obstructive pulmonary disease, unspecified: Secondary | ICD-10-CM | POA: Diagnosis not present

## 2018-06-26 DIAGNOSIS — G609 Hereditary and idiopathic neuropathy, unspecified: Secondary | ICD-10-CM | POA: Diagnosis not present

## 2018-09-25 DIAGNOSIS — M542 Cervicalgia: Secondary | ICD-10-CM | POA: Diagnosis not present

## 2018-09-25 DIAGNOSIS — J449 Chronic obstructive pulmonary disease, unspecified: Secondary | ICD-10-CM | POA: Diagnosis not present

## 2018-09-25 DIAGNOSIS — F900 Attention-deficit hyperactivity disorder, predominantly inattentive type: Secondary | ICD-10-CM | POA: Diagnosis not present

## 2018-12-25 DIAGNOSIS — G894 Chronic pain syndrome: Secondary | ICD-10-CM | POA: Diagnosis not present

## 2018-12-25 DIAGNOSIS — M542 Cervicalgia: Secondary | ICD-10-CM | POA: Diagnosis not present

## 2018-12-25 DIAGNOSIS — G609 Hereditary and idiopathic neuropathy, unspecified: Secondary | ICD-10-CM | POA: Diagnosis not present

## 2019-01-06 ENCOUNTER — Other Ambulatory Visit: Payer: Self-pay

## 2019-01-06 ENCOUNTER — Ambulatory Visit: Payer: HRSA Program | Attending: Internal Medicine

## 2019-01-06 DIAGNOSIS — Z20828 Contact with and (suspected) exposure to other viral communicable diseases: Secondary | ICD-10-CM | POA: Diagnosis not present

## 2019-01-06 DIAGNOSIS — Z20822 Contact with and (suspected) exposure to covid-19: Secondary | ICD-10-CM

## 2019-01-07 NOTE — Progress Notes (Signed)
Order(s) created erroneously. Erroneous order ID: [id number]  Order moved by: Brigitte Pulse  Order move date/time: 01/07/2019 12:26 PM  Source Patient: [id number]  Source Contact: 01/06/2019  Destination Patient: [id number]  Destination Contact: 01/06/2019

## 2019-01-07 NOTE — Progress Notes (Signed)
Order(s) created erroneously. Erroneous order ID: [id number]  Order moved by: Ying Blankenhorn M  Order move date/time: 01/07/2019 12:26 PM  Source Patient: [id number]  Source Contact: 01/06/2019  Destination Patient: [id number]  Destination Contact: 01/06/2019 

## 2019-01-07 NOTE — Progress Notes (Signed)
Moving orders to this encounter.  

## 2019-01-08 ENCOUNTER — Telehealth: Payer: Self-pay | Admitting: *Deleted

## 2019-01-08 LAB — NOVEL CORONAVIRUS, NAA: SARS-CoV-2, NAA: NOT DETECTED

## 2019-01-08 NOTE — Telephone Encounter (Signed)
Pt notified that he tested negative for covid 19. He voiced understanding. 

## 2019-05-25 IMAGING — MR MR LUMBAR SPINE W/O CM
4 of 5 series · 13 of 48 positions shown · non-contrast
Comparison: Lumbar radiographs 04/11/2012.

CLINICAL DATA: 56-year-old male with severe lumbar back pain and
right lower extremity radiculopathy for 3 days following strenuous
work.

EXAM:
MRI LUMBAR SPINE WITHOUT CONTRAST
TECHNIQUE: Multiplanar, multisequence MR imaging of the lumbar spine was
performed. No intravenous contrast was administered.

[Series 3: T2 · sagittal · 4.0mm · 0.66mm/px · 4 of 15 slices shown (1 of 2)]
[im 1/15]
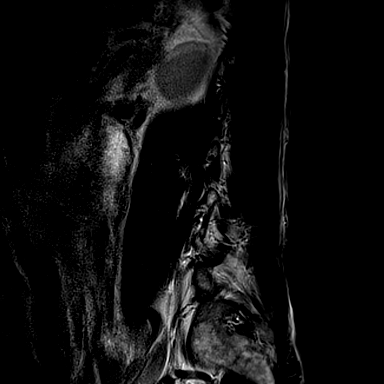
[im 3/15]
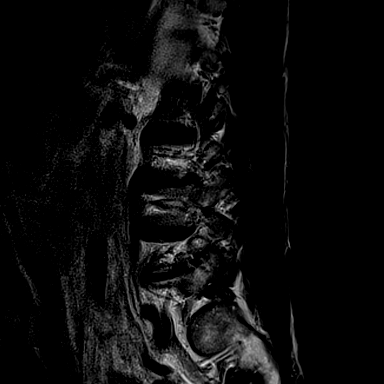
[im 9/15]
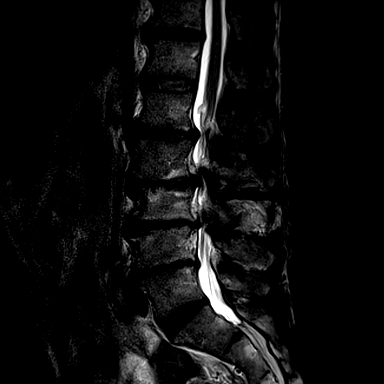
[im 15/15]
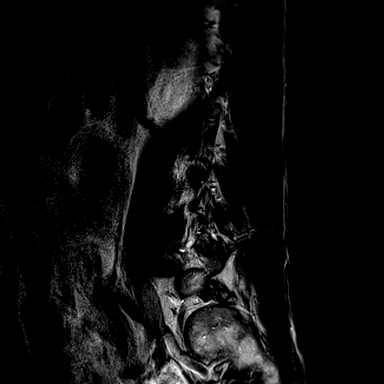

[Series 4: T1 · sagittal · 4.0mm · 0.33mm/px · 3 of 15 slices shown (1 of 2)]
[im 3/15]
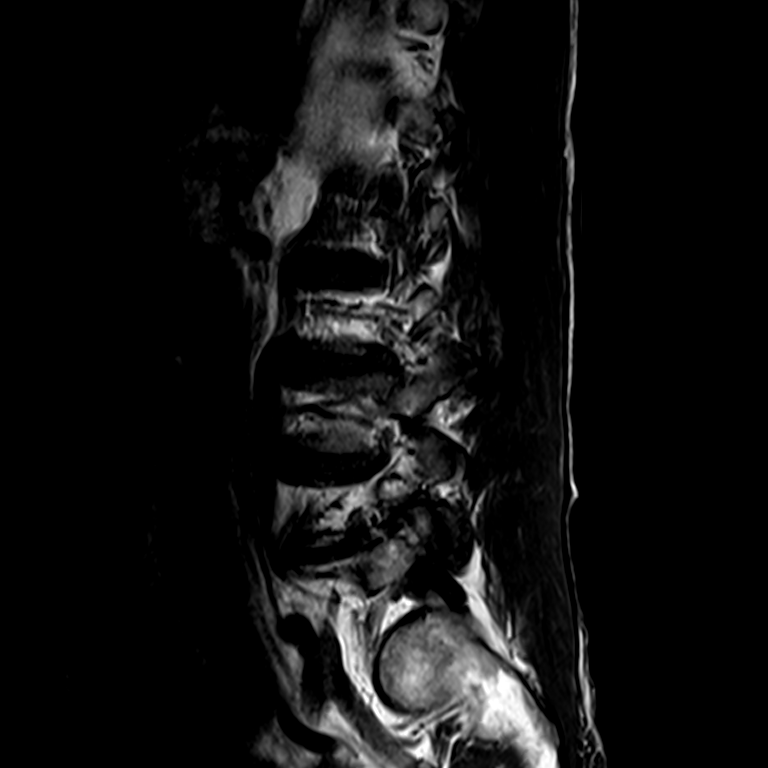
[im 9/15]
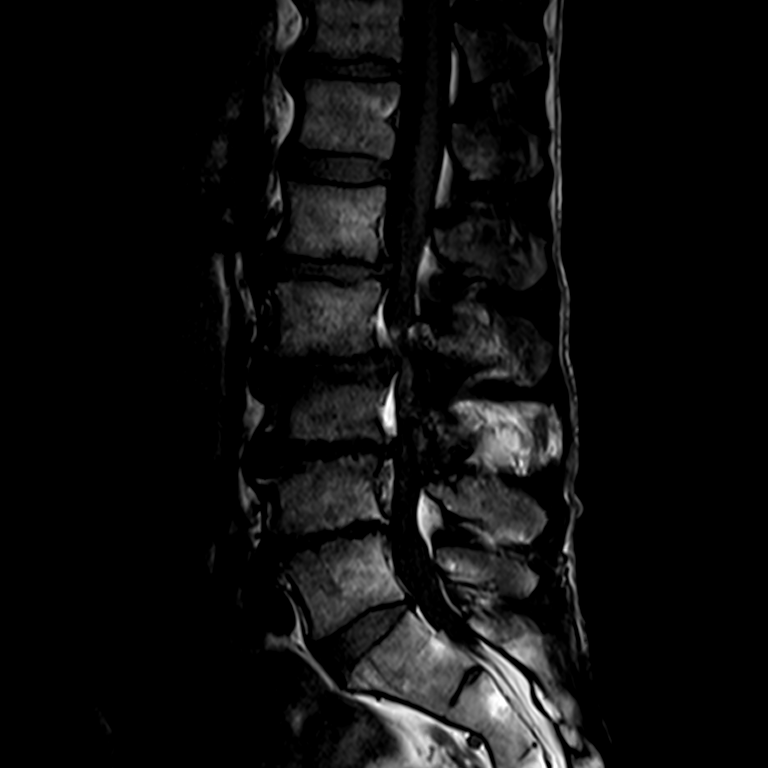
[im 15/15]
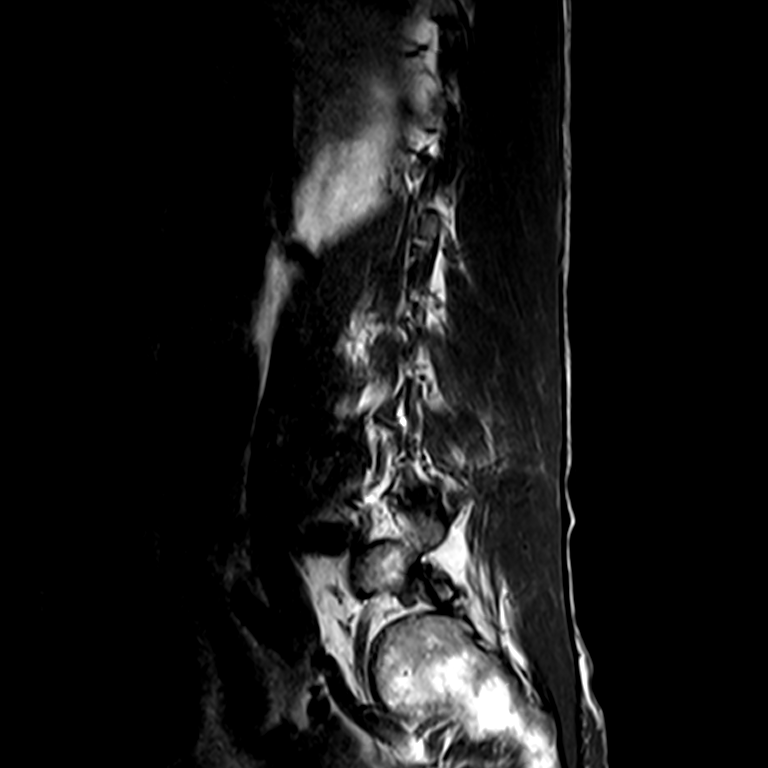

[Series 6: T2 · axial · 4.0mm · 0.25mm/px · z∈[-65,+64]mm · 3 of 37 slices shown (2 of 2)]
[im 6/37]
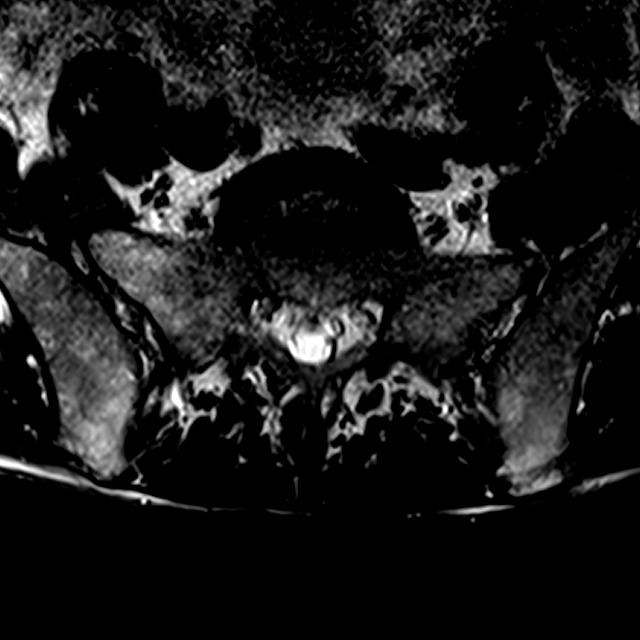
[im 19/37]
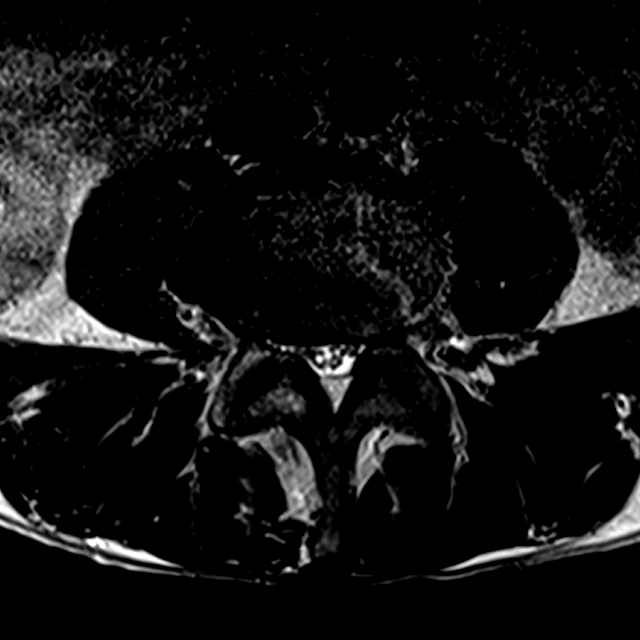
[im 31/37]
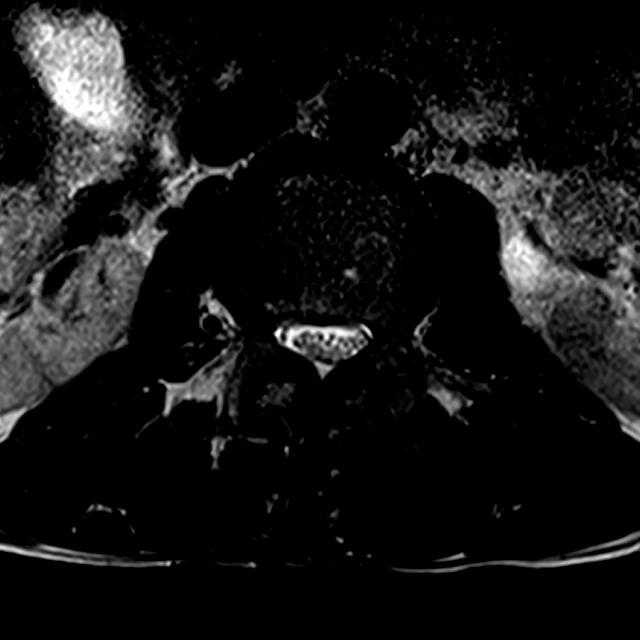

[Series 7: T1 · axial · 4.0mm · 0.25mm/px · z∈[-65,+64]mm · 3 of 37 slices shown (2 of 2)]
[im 6/37]
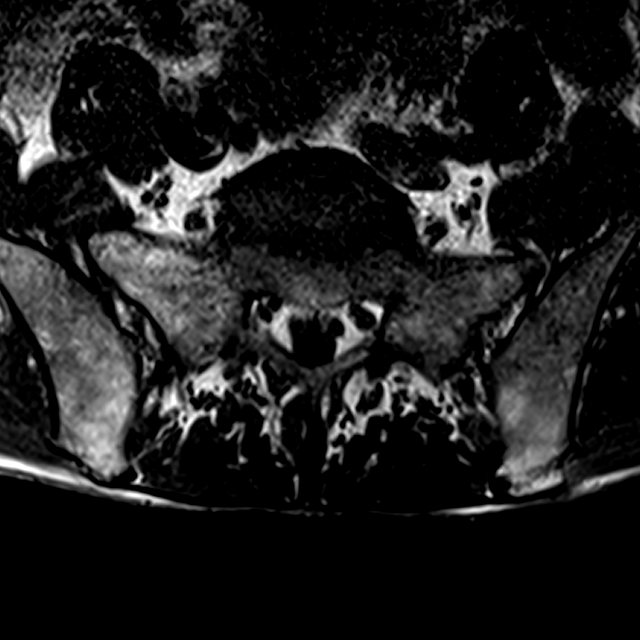
[im 19/37]
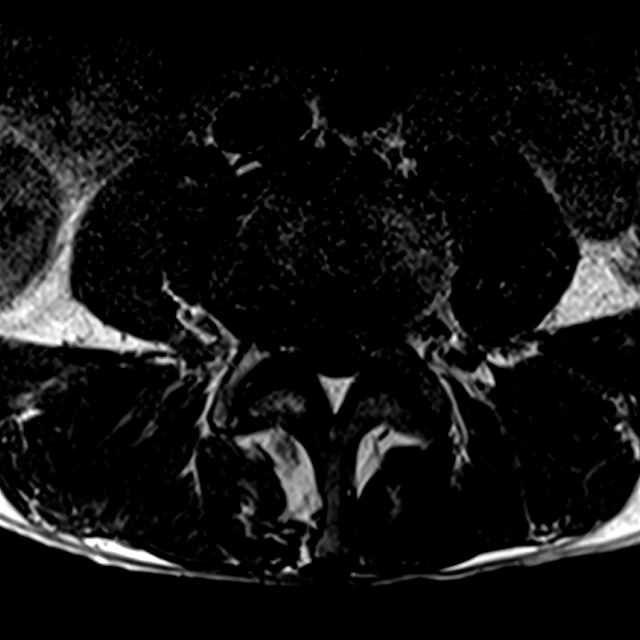
[im 31/37]
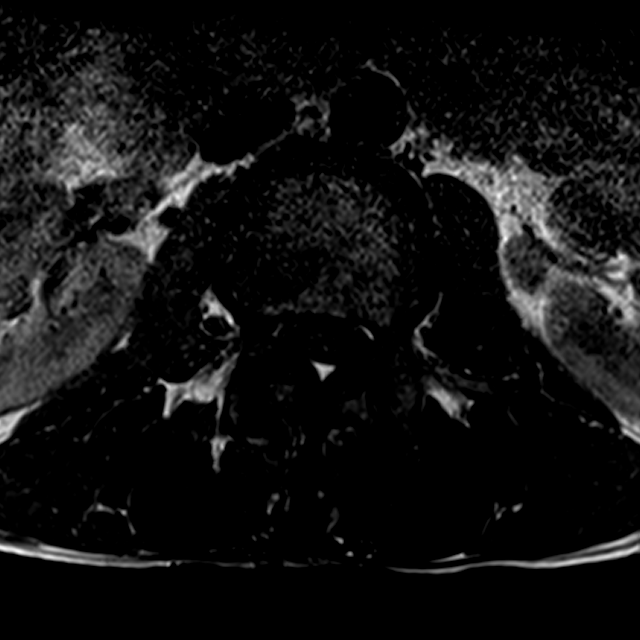

[13 of 48 positions shown; findings below may reference images not displayed]

FINDINGS: Segmentation:  Normal on the prior radiographs.

Alignment: Chronic straightening of lumbar lordosis. Grade 1
anterolisthesis has developed at L2-L3 since 0059 and measures 4
millimeters. Subtle anterolisthesis has developed at L1-L2.

Vertebrae: Degenerative appearing endplate marrow edema at the
L2-L3, L3-L4, and superior L5 levels. Superimposed chronic
degenerative marrow signal changes throughout the lumbar spine.
Degenerative marrow edema versus benign vertebral hemangioma in the
right L3 transverse process. Intact visible sacrum and SI joints.

Conus medullaris and cauda equina: Conus extends to the L1-L2 level.
No lower thoracic spinal cord or conus signal abnormality.

Paraspinal and other soft tissues: Negative.

Disc levels:

T11-T12: Mild disc bulge and facet hypertrophy.

T12-L1:  Negative.

L1-L2: Mild anterolisthesis with disc space loss. Circumferential
disc bulge with broad-based posterior and foraminal involvement.
Mild facet and ligament flavum hypertrophy. Mild to moderate spinal
stenosis and bilateral L1 foraminal stenosis.

L2-L3: Grade 1 anterolisthesis with moderate to severe disc space
loss. Circumferential disc bulge with superimposed right foraminal
12 millimeter disc extrusion (series 3, image 5 and series 6, image
11). Moderate to severe facet and ligament flavum hypertrophy.
Severe spinal stenosis with severe right lateral recess stenosis and
right foraminal stenosis (descending right L3 and exiting right L2
nerve levels, respectively). Moderate left lateral recess and mild
to moderate left L2 foraminal stenosis.

L3-L4: Severe disc space loss with suspected vacuum disc.
Circumferential disc bulge and endplate spurring with mild to
moderate facet and ligament flavum hypertrophy greater on the right.
Mild spinal stenosis with mild to moderate lateral recess stenosis
greater on the left (left L3 nerve level). Moderate to severe
bilateral L3 foraminal stenosis, greater on the right.

L4-L5: Moderate to severe disc space loss with left eccentric
circumferential disc osteophyte complex. Moderate facet and ligament
flavum hypertrophy. Mild to moderate spinal stenosis with moderate
to severe left and moderate right lateral recess stenosis (L5 nerve
levels). Moderate to severe left and moderate right L4 neural
foraminal stenosis.

L5-S1: Mild disc bulge. Moderate facet hypertrophy. No significant
stenosis.
IMPRESSION: 1. Widespread advanced lumbar spine degeneration. Grade 1
anterolisthesis at L1-L2 and L2-L3 has developed since [DATE]. Multiple levels of moderate spinal stenosis and neural
impingement, but the symptomatic level with regard to right side
radiculopathy might be L2-L3 where right foraminal disc herniation
contributes to severe right L2 nerve level foraminal stenosis. There
is also severe right lateral recess stenosis there affecting the
right L3 nerve level, and associated with severe overall spinal
stenosis.
3. There is also moderate to severe right L3 neural foraminal
stenosis at L3-L4. Degenerative vertebral marrow edema from the L2
to the L5 level.

## 2019-07-04 IMAGING — CR DG CHEST 2V
2 series · 2 of 2 positions shown · non-contrast
Comparison: Chest x-ray dated July 04, 2004.

CLINICAL DATA: Preoperative study for lumbar spine surgery. Current
smoker.

EXAM:
CHEST - 2 VIEW

[w chest pa]
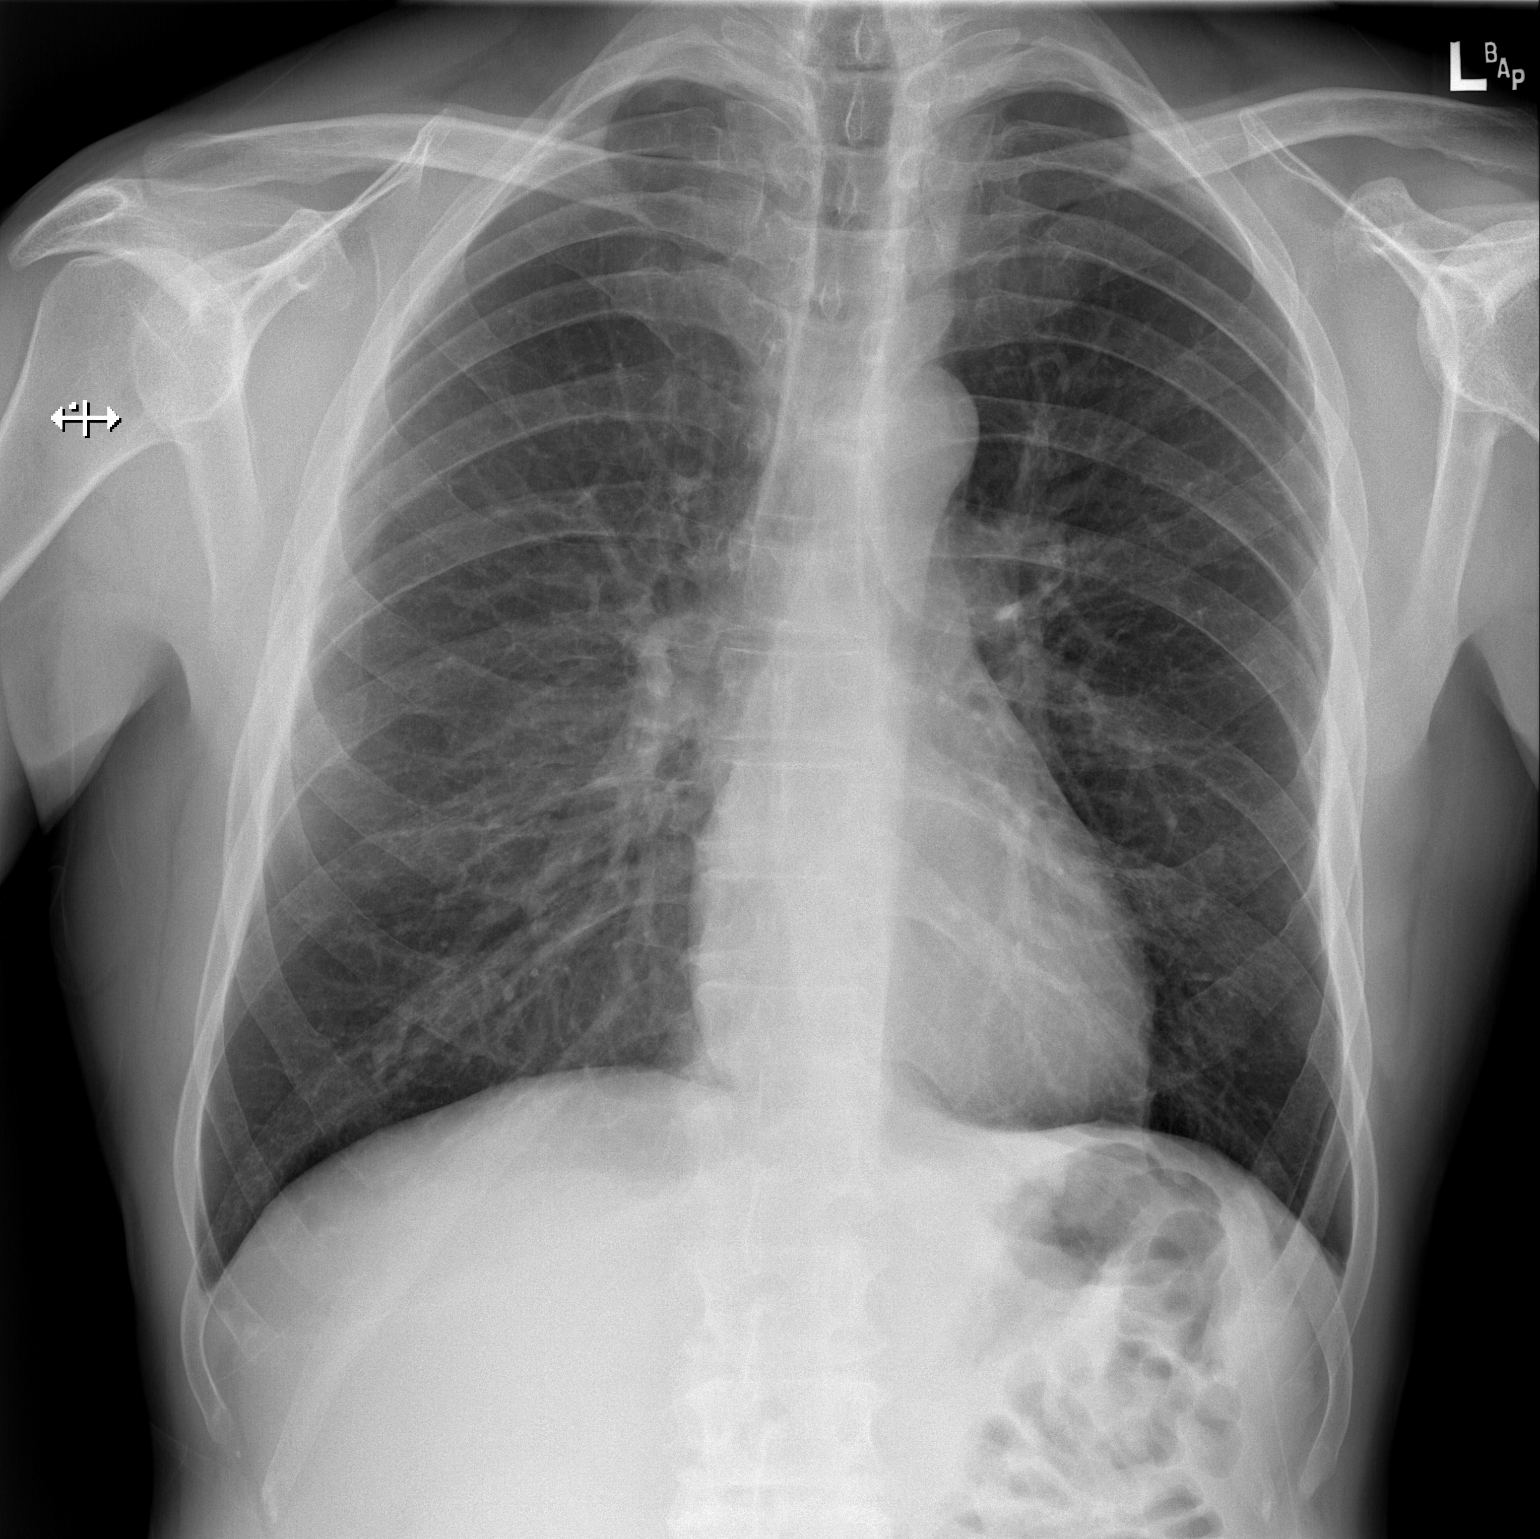

[w chest lat]
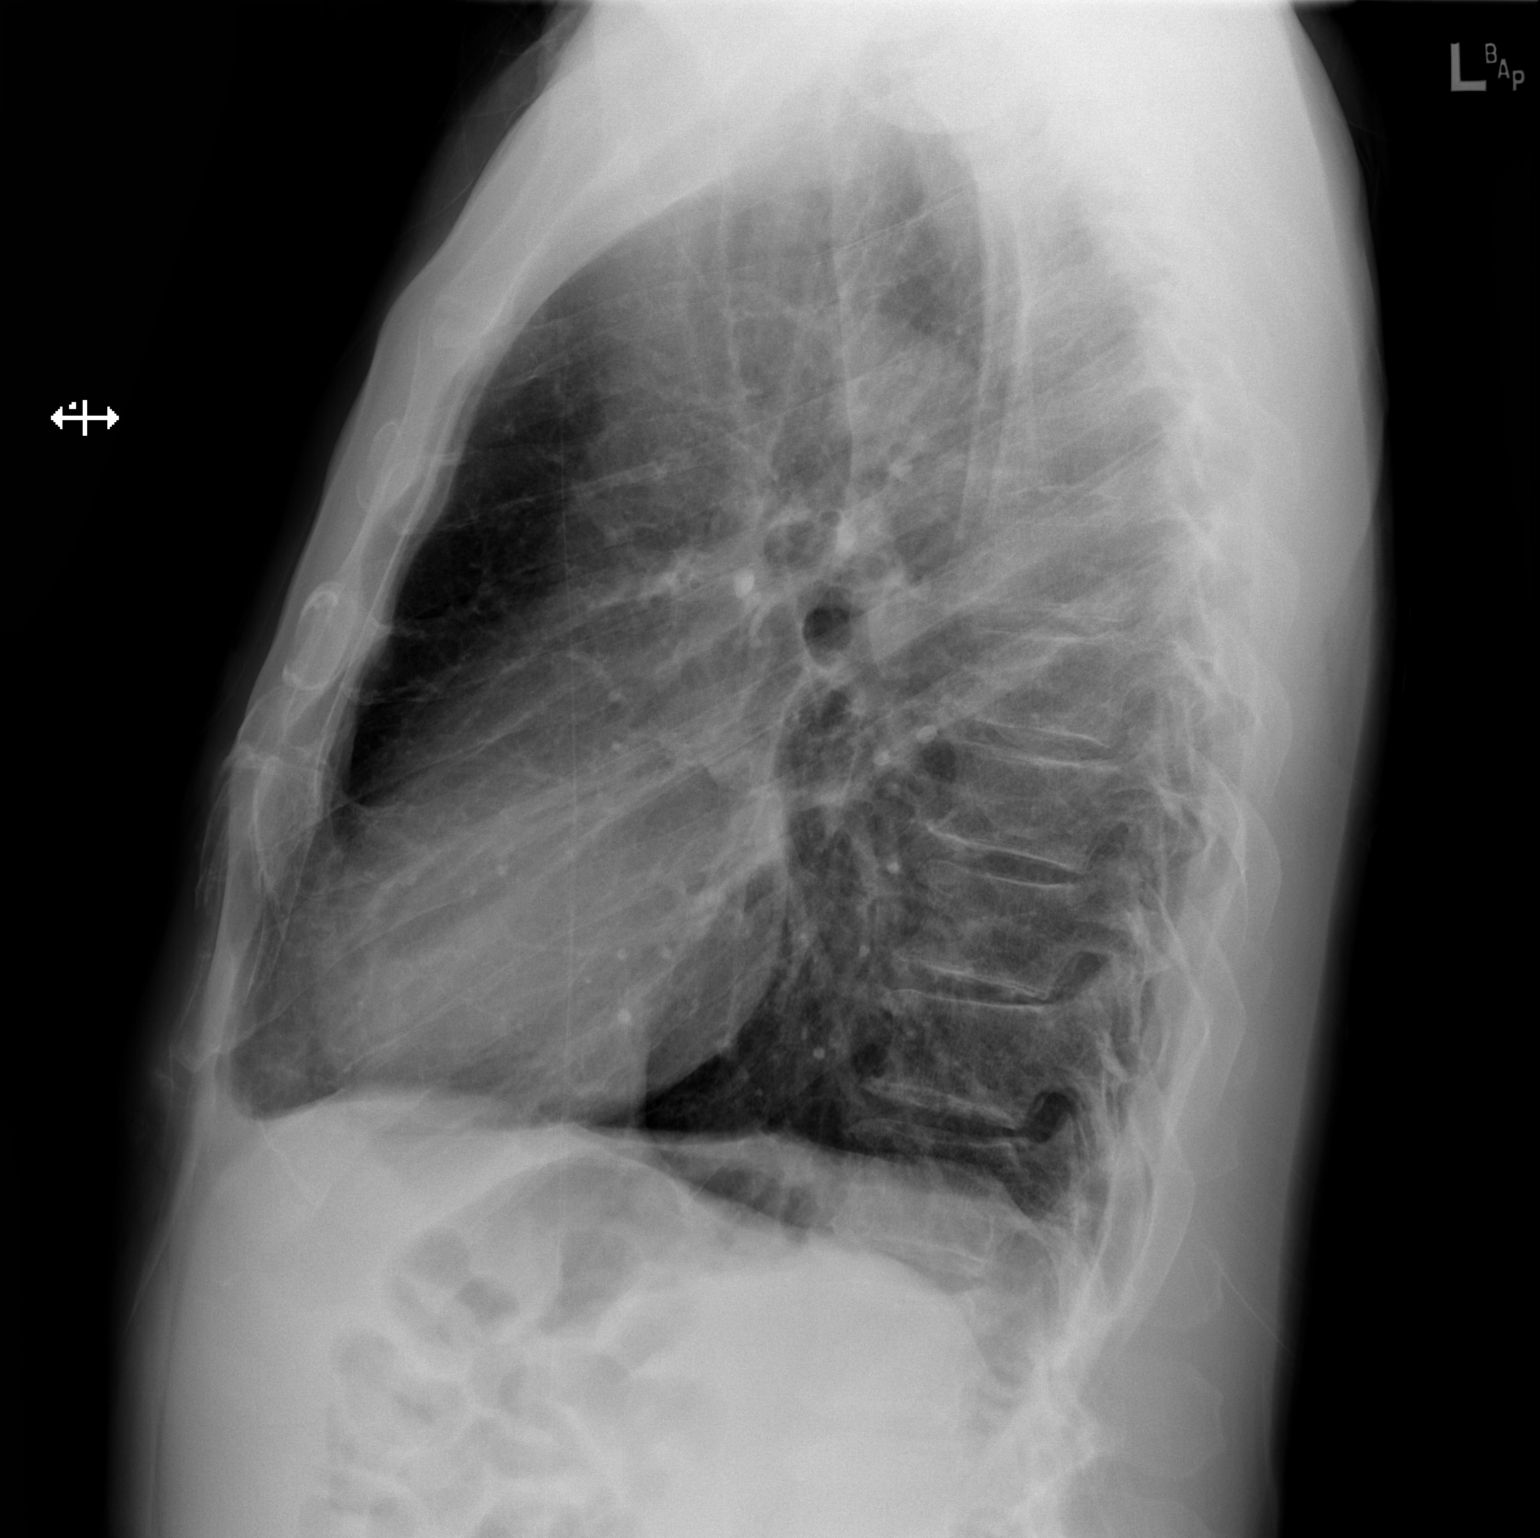

[2 of 2 positions shown; findings below may reference images not displayed]

FINDINGS: The heart size and mediastinal contours are within normal limits.
Both lungs are clear. The visualized skeletal structures are
unremarkable.
IMPRESSION: No active cardiopulmonary disease.

## 2019-12-24 ENCOUNTER — Other Ambulatory Visit (HOSPITAL_COMMUNITY): Payer: Self-pay | Admitting: Family Medicine

## 2019-12-24 ENCOUNTER — Ambulatory Visit (HOSPITAL_COMMUNITY)
Admission: RE | Admit: 2019-12-24 | Discharge: 2019-12-24 | Disposition: A | Discharge status: Home / Self Care | Payer: BC Managed Care – PPO | Source: Ambulatory Visit | Attending: Family Medicine | Admitting: Family Medicine

## 2019-12-24 ENCOUNTER — Other Ambulatory Visit: Payer: Self-pay

## 2019-12-24 DIAGNOSIS — G8929 Other chronic pain: Secondary | ICD-10-CM | POA: Diagnosis not present

## 2020-09-26 ENCOUNTER — Encounter: Payer: Self-pay | Admitting: *Deleted

## 2021-01-22 ENCOUNTER — Ambulatory Visit
Admission: EM | Admit: 2021-01-22 | Discharge: 2021-01-22 | Disposition: A | Discharge status: Home / Self Care | Payer: Commercial Managed Care - PPO | Attending: Urgent Care | Admitting: Urgent Care

## 2021-01-22 ENCOUNTER — Other Ambulatory Visit: Payer: Self-pay

## 2021-01-22 ENCOUNTER — Encounter: Payer: Self-pay | Admitting: Emergency Medicine

## 2021-01-22 DIAGNOSIS — K644 Residual hemorrhoidal skin tags: Secondary | ICD-10-CM

## 2021-01-22 DIAGNOSIS — L409 Psoriasis, unspecified: Secondary | ICD-10-CM

## 2021-01-22 DIAGNOSIS — R1031 Right lower quadrant pain: Secondary | ICD-10-CM | POA: Diagnosis not present

## 2021-01-22 DIAGNOSIS — K529 Noninfective gastroenteritis and colitis, unspecified: Secondary | ICD-10-CM | POA: Diagnosis not present

## 2021-01-22 MED ORDER — CIPROFLOXACIN HCL 500 MG PO TABS: 500.0000 mg | ORAL_TABLET | Freq: Two times a day (BID) | ORAL | 0 refills | Status: DC

## 2021-01-22 MED ORDER — CLOBETASOL PROPIONATE 0.05 % EX OINT: 1.0000 "application " | TOPICAL_OINTMENT | Freq: Two times a day (BID) | CUTANEOUS | 0 refills | Status: DC

## 2021-01-22 MED ORDER — ONDANSETRON 8 MG PO TBDP: 8.0000 mg | ORAL_TABLET | Freq: Three times a day (TID) | ORAL | 0 refills | Status: DC | PRN

## 2021-01-22 NOTE — ED Provider Notes (Signed)
[place]   MRN: [id number] DOB: [birth year]  Subjective:   Colin Ellis is a 61 y.o. male presenting for 2-week history of persistent and worsening stools, gassiness.  The stools are dark brown.  No blood in the stool.  Has been worsening in the past few days.  No history of ulcerative colitis or Crohn's disease.  No history of diverticulitis or diverticulosis.  Has had bilateral lower abdominal pain.  No fevers, nausea, vomiting.  Patient would also like a refill of his clobetasol ointment that he uses for psoriasis.  No recent long distance travel outside the country, has not eaten raw foods, dietary routine is the same.  No recent antibiotic use or hospitalizations.  No current facility-administered medications for this encounter.  Current Outpatient Medications:    amphetamine-dextroamphetamine (ADDERALL) 30 MG tablet, Take 15 mg by mouth 3 (three) times daily as needed. For focus with work, Disp: , Rfl: 0   Coal Tar Extract [phone] PSORIASIS MEDICATED EX), Apply 1 application topically 2 (two) times daily., Disp: , Rfl:    LORazepam (ATIVAN) 1 MG tablet, Take 1 mg by mouth at bedtime., Disp: , Rfl:    methocarbamol (ROBAXIN) 500 MG tablet, Take 1 tablet (500 mg total) by mouth every 6 (six) hours as needed for muscle spasms., Disp: 30 tablet, Rfl: 0   naproxen sodium (ANAPROX) 220 MG tablet, Take 220-440 mg by mouth 3 (three) times daily as needed (for pain.). 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[place])    Arthritis    Electrical injury in adult    Electrician by trade, "several shocks during working career"    Psoriasis    Teeth missing due to trauma      Past Surgical History:  Procedure Laterality Date   COLONOSCOPY   11/12/2010   Procedure: COLONOSCOPY;  Surgeon: Daneil Dolin, MD;  Location: [facility];  Service: Endoscopy;  Laterality: N/A;  7:30 AM    Family History  Problem Relation Age of Onset   Anesthesia problems Neg Hx    Hypotension Neg Hx    Malignant hyperthermia Neg Hx    Pseudochol deficiency Neg Hx     Social History   Tobacco Use   Smoking status: Every Day    Packs/day: 1.00    Years: 40.00    Pack years: 40.00    Types: Cigarettes   Smokeless tobacco: Never  Vaping Use   Vaping Use: Never used  Substance Use Topics   Alcohol use: Yes    Alcohol/week: 4.0 standard drinks    Types: 4 Cans of beer per week    Comment: 3-4 beers a night   Drug use: No    ROS   Objective:   Vitals: BP (!) 147/84    Pulse 68    Temp 99.2 F (37.3 C) (Oral)    Resp 20    SpO2 96%   Physical Exam Constitutional:      General: He is not in acute distress.    Appearance: Normal appearance. He is well-developed and normal weight. He is not ill-appearing, toxic-appearing or diaphoretic.  HENT:     Head: Normocephalic and atraumatic.  Right Ear: External ear normal.     Left Ear: External ear normal.     Nose: Nose normal.     Mouth/Throat:     Pharynx: Oropharynx is clear.  Eyes:     General: No scleral icterus.       Right eye: No discharge.        Left eye: No discharge.     Extraocular Movements: Extraocular movements intact.     Pupils: Pupils are equal, round, and reactive to light.  Cardiovascular:     Rate and Rhythm: Normal rate.  Pulmonary:     Effort: Pulmonary effort is normal.  Abdominal:     General: Bowel sounds are increased. There is no distension.     Palpations: Abdomen is soft. There is no mass.     Tenderness: There is abdominal tenderness (mild) in the right lower quadrant and left lower quadrant. There is no right CVA tenderness, left CVA tenderness, guarding or rebound.     Comments: Hyperactive bowel sounds.  Musculoskeletal:     Cervical  back: Normal range of motion.  Neurological:     Mental Status: He is alert and oriented to person, place, and time.  Psychiatric:        Mood and Affect: Mood normal.        Behavior: Behavior normal.        Thought Content: Thought content normal.        Judgment: Judgment normal.    Assessment and Plan :   PDMP not reviewed this encounter.  1. Colitis   2. Psoriasis   3. External hemorrhoids   4. RLQ abdominal pain    Will use a trial of ciprofloxacin, patient refused a stool culture.  At this stage, I do not suspect that he has an acute abdomen.  Recommended hydrating very well, eating light meals, continued use of loperamide and will add Zofran.  I refilled his clobetasol ointment. Counseled patient on potential for adverse effects with medications prescribed/recommended today, ER and return-to-clinic precautions discussed, patient verbalized understanding.    Jaynee Eagles, PA-C 01/22/21 1540

## 2021-01-22 NOTE — ED Triage Notes (Signed)
Pt here with 2 week hx of increased amount of stools per day as well as painful gas and flatulence. Pt endorses loose stools that are dark brown in color. Pt states that normally he goes only twice a day and is now going over 8x day. Endorses bilateral lower abdominal pain that is sharp and cramping. Denies nausea. Pt states that he is currently experiencing a psoriasis flare as well.

## 2021-01-22 NOTE — Discharge Instructions (Addendum)
Make sure you push fluids drinking mostly water but mix it with Gatorade.  Try to eat light meals including soups, broths and soft foods, fruits.  You may use Zofran for your nausea and vomiting once every 8 hours.  Imodium can help with diarrhea but use this carefully limiting it to 3 times per day only if you are having a lot of diarrhea.  Please return to the clinic if symptoms worsen or you start having severe abdominal pain not helped by taking Tylenol or start having bloody stools or blood in the vomit.

## 2021-02-13 ENCOUNTER — Encounter: Payer: Self-pay | Admitting: Internal Medicine

## 2021-02-13 ENCOUNTER — Telehealth: Payer: Self-pay | Admitting: Internal Medicine

## 2021-02-13 NOTE — Telephone Encounter (Signed)
Spoke to wife.  She informed me that she wants to see if we can get pt in soon.  Informed her that we received paperwork and I have taken it up front to schedulers but I will inform them to keep pt in mind if we get cancellation.  She is requesting an early morning Friday appt if possible.

## 2021-02-13 NOTE — Telephone Encounter (Signed)
Pt's wife, Lawson Fiscal, called to say that she dropped off the patient's questionnaire yesterday and she has additional information she wanted the nurse to know about. You can reach her at work at [phone] or if it's after 1pm you can reach her at home at [phone]

## 2021-06-07 ENCOUNTER — Encounter: Payer: Self-pay | Admitting: Gastroenterology

## 2021-06-07 NOTE — Progress Notes (Signed)
Primary Care Physician:  Alanson Puls, [facility] Primary Gastroenterologist:  Dr. Gala Romney  Chief Complaint  Patient presents with   New Patient (Initial Visit)    Ov for colonoscopy and gas, diarrhea since Christmas    HPI:   Colin Ellis is a 61 y.o. male presenting today to discuss colonoscopy.  Recommended office visit due to change in bowel habits.  Last colonoscopy was in October 2012 with pancolonic diverticulosis, otherwise normal exam.  In December, patient developed acute onset watery diarrhea with 5 or 6 bowel movements daily, lower abdominal pressure/cramping, and increased gassiness.  He went to urgent care.  They prescribed him an antibiotic as they thought he had a bacterial infection.  He completed a 5-day course with improvement in symptoms, but after the antibiotic was completed, diarrhea seem to return.  Over time, symptoms have improved.  Having 1-3 loose to mushy bowel movements daily, typically after meals.  Still with some lower abdominal cramping prior to bowel movements that improves thereafter.  No nocturnal stools, BRBPR, melena, unintentional weight loss.  He has been taking Gas-X, fiber, and eating yogurt which he feels like is helping.  Prior to onset, denies antibiotics, well water, sick contacts.  He did stop lorazepam and Adderall in November.  Otherwise, no medication changes.  Takes 2-3 Aleve every morning for chronic back pain.   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    Current Outpatient Medications  Medication Sig Dispense Refill   clobetasol ointment (TEMOVATE) [id number] %  Apply 1 application topically 2 (two) times daily. 60 g 0   dicyclomine (BENTYL) 10 MG capsule Take 1 capsule (10 mg total) by mouth 3 (three) times daily before meals. 90 capsule 1   polyethylene glycol-electrolytes (NULYTELY) 420 g solution As directed 4000 mL 0   No current facility-administered medications for this visit.    Allergies as of 06/08/2021 - Review Complete 06/08/2021  Allergen Reaction Noted   Sulfa antibiotics Nausea And Vomiting 10/16/2010    Family History  Problem Relation Age of Onset   Colon cancer Paternal Grandfather        58s or 79s   Anesthesia problems Neg Hx    Hypotension Neg Hx    Malignant hyperthermia Neg Hx    Pseudochol deficiency Neg Hx     Social History   Socioeconomic History   Marital status: Married    Spouse name: Not on file   Number of children: Not on file   Years of education: Not on file   Highest education level: Not on file  Occupational History   Not on file  Tobacco Use   Smoking status: Every Day    Packs/day: 1.00    Years: 40.00    Pack years: 40.00    Types: Cigarettes   Smokeless tobacco: Never  Vaping Use   Vaping Use: Never used  Substance and Sexual Activity   Alcohol use: Yes    Alcohol/week: 4.0 standard drinks    Types: 4 Cans of beer per week  Comment: 3-4 beers a night   Drug use: No   Sexual activity: Not on file  Other Topics Concern   Not on file  Social History Narrative   Not on file   Social Determinants of Health   Financial Resource Strain: Not on file  Food Insecurity: Not on file  Transportation Needs: Not on file  Physical Activity: Not on file  Stress: Not on file  Social Connections: Not on file  Intimate Partner Violence: Not on file    Review of Systems: Gen: Denies any fever, chills, cold or flulike symptoms, presyncope, syncope. CV: Denies chest pain, heart palpitations. Resp: Denies shortness of breath or cough.  GI: See HPI GU : Denies urinary burning, urinary  frequency, urinary hesitancy MS: Admits to chronic back pain.  Derm: Denies rash. Psych: Denies depression, anxiety. Heme: See HPI  Physical Exam: BP 130/64   Pulse 67   Temp 97.8 F (36.6 C)   Ht 5\' 8"  (1.727 m)   Wt 154 lb 9.6 oz (70.1 kg)   BMI 23.51 kg/m  General:   Alert and oriented. Pleasant and cooperative. Well-nourished and well-developed.  Head:  Normocephalic and atraumatic. Eyes:  Without icterus, sclera clear and conjunctiva pink.  Ears:  Normal auditory acuity. Lungs:  Clear to auscultation bilaterally. No wheezes, rales, or rhonchi. No distress.  Heart:  S1, S2 present without murmurs appreciated.  Abdomen:  +BS, soft, non-tender and non-distended. No HSM noted. No guarding or rebound. No masses appreciated.  Rectal:  Deferred  Msk:  Symmetrical without gross deformities. Normal posture. Extremities:  Without edema. Neurologic:  Alert and  oriented x4;  grossly normal neurologically. Skin:  Intact without significant lesions or rashes. Psych:  Normal mood and affect.    Assessment:  61 year old male with history of ADHD, chronic back pain, presenting today to discuss scheduling colonoscopy and also for further evaluation of change in bowel habits.  In December, he developed acute change in bowel habits with watery diarrhea, lower abdominal cramping/pressure, and increased gassiness.  Urgent care prescribed antibiotic which did improve symptoms, but after he completed the course, symptoms seem to return.  Over time, his symptoms have slowly improved.  Now having 1-3 loose to mushy bowel movements daily, typically postprandial, with associated mild lower abdominal cramping that improves after a bowel movement.  No alarm symptoms.  No antibiotics, well water, sick contacts prior to diarrhea onset.    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   I recommended that he continue his daily fiber supplement,  continue to use Gas-X as needed, start daily probiotic, and use dicyclomine as needed.    He is also due for colon cancer screening with last colonoscopy in October 2012 with pancolonic diverticulosis, otherwise normal exam. We will arrange this in the near future.  Patient is also taking 2-3 Aleve every morning.  We discussed the risk of GI irritation related to daily NSAID use.  Encouraged him to limit this as much as possible.   Plan:  Proceed with colonoscopy with propofol by Dr. Gala Romney in near future. The risks, benefits, and alternatives have been discussed with the patient in detail. The patient states understanding and desires to proceed. ASA 2 Start daily probiotic. Start dicyclomine 10 mg up to 3 times daily before meals as needed.  Recommended starting with once daily.  Hold in the setting of constipation. Continue daily fiber supplement. Continue to use Gas-X as needed. Limit NSAID use.  Follow-up after colonoscopy.   Aliene Altes, PA-C [facility] 06/08/2021

## 2021-06-08 ENCOUNTER — Encounter: Payer: Self-pay | Admitting: Gastroenterology

## 2021-06-08 ENCOUNTER — Ambulatory Visit (INDEPENDENT_AMBULATORY_CARE_PROVIDER_SITE_OTHER): Payer: Commercial Managed Care - PPO | Admitting: Gastroenterology

## 2021-06-08 ENCOUNTER — Encounter: Payer: Self-pay | Admitting: *Deleted

## 2021-06-08 VITALS — BP 130/64 | HR 67 | Temp 97.8°F | Ht 68.0 in | Wt 154.6 lb

## 2021-06-08 DIAGNOSIS — R197 Diarrhea, unspecified: Secondary | ICD-10-CM | POA: Insufficient documentation

## 2021-06-08 DIAGNOSIS — Z1211 Encounter for screening for malignant neoplasm of colon: Secondary | ICD-10-CM | POA: Diagnosis not present

## 2021-06-08 MED ORDER — PEG 3350-KCL-NA BICARB-NACL 420 G PO SOLR: ORAL | 0 refills | Status: DC

## 2021-06-08 MED ORDER — DICYCLOMINE HCL 10 MG PO CAPS: 10.0000 mg | ORAL_CAPSULE | Freq: Three times a day (TID) | ORAL | 1 refills | Status: DC

## 2021-06-08 NOTE — Patient Instructions (Signed)
We will arrange to have a colonoscopy in the near future with Dr. Jena Gauss.  I suspect your diarrhea/abdominal cramping is secondary to postinfectious irritable bowel syndrome. - Continue taking fiber daily. -Continue to use Gas-X as needed. - Try starting a daily probiotic such as align, digestive advantage, Vear Clock' colon health.  There are several other over-the-counter options you can choose from. -I am prescribing dicyclomine 10 mg.  You can take as needed before meals to help prevent diarrhea and abdominal cramping up to 3 times a day.  I would recommend starting once every morning and monitoring your response.  Hold in the setting of constipation.  As your symptoms improve, you do not have to continue taking dicyclomine.  We will follow-up with you in the office after your colonoscopy.  Do not hesitate to call if you have any questions or concerns prior to your next visit.  It was very nice meeting you today!  Ermalinda Memos, PA-C [facility]

## 2021-07-04 ENCOUNTER — Telehealth: Payer: Self-pay | Admitting: *Deleted

## 2021-07-04 NOTE — Telephone Encounter (Signed)
Called [facility] as website is down. Was advised to call [phone] for PA for TCS.  PA pending. Case ref# [id number]. Clinicals faxed to [phone]

## 2021-07-05 NOTE — Telephone Encounter (Signed)
PA approved. DOS 07/09/21-10/06/21

## 2021-07-09 ENCOUNTER — Ambulatory Visit (HOSPITAL_BASED_OUTPATIENT_CLINIC_OR_DEPARTMENT_OTHER): Payer: Commercial Managed Care - PPO | Admitting: Anesthesiology

## 2021-07-09 ENCOUNTER — Encounter (HOSPITAL_COMMUNITY): Payer: Self-pay | Admitting: Internal Medicine

## 2021-07-09 ENCOUNTER — Other Ambulatory Visit: Payer: Self-pay

## 2021-07-09 ENCOUNTER — Ambulatory Visit (HOSPITAL_COMMUNITY)
Admission: RE | Admit: 2021-07-09 | Discharge: 2021-07-09 | Disposition: A | Discharge status: Home / Self Care | Payer: Commercial Managed Care - PPO | Attending: Internal Medicine | Admitting: Internal Medicine

## 2021-07-09 ENCOUNTER — Ambulatory Visit (HOSPITAL_COMMUNITY): Payer: Commercial Managed Care - PPO | Admitting: Anesthesiology

## 2021-07-09 ENCOUNTER — Encounter (HOSPITAL_COMMUNITY)
Admission: RE | Disposition: A | Discharge status: Home / Self Care | Payer: Self-pay | Source: Home / Self Care | Attending: Internal Medicine

## 2021-07-09 DIAGNOSIS — Z1211 Encounter for screening for malignant neoplasm of colon: Secondary | ICD-10-CM

## 2021-07-09 DIAGNOSIS — K5730 Diverticulosis of large intestine without perforation or abscess without bleeding: Secondary | ICD-10-CM

## 2021-07-09 DIAGNOSIS — Z800 Family history of malignant neoplasm of digestive organs: Secondary | ICD-10-CM | POA: Insufficient documentation

## 2021-07-09 DIAGNOSIS — R197 Diarrhea, unspecified: Secondary | ICD-10-CM

## 2021-07-09 DIAGNOSIS — R194 Change in bowel habit: Secondary | ICD-10-CM | POA: Insufficient documentation

## 2021-07-09 DIAGNOSIS — F17210 Nicotine dependence, cigarettes, uncomplicated: Secondary | ICD-10-CM | POA: Diagnosis not present

## 2021-07-09 DIAGNOSIS — Z981 Arthrodesis status: Secondary | ICD-10-CM

## 2021-07-09 SURGERY — COLONOSCOPY WITH PROPOFOL
Anesthesia: General

## 2021-07-09 MED ORDER — PROPOFOL 10 MG/ML IV BOLUS
INTRAVENOUS | Status: DC | PRN
  Administered 2021-07-09 (×2): 100 mg via INTRAVENOUS
  Administered 2021-07-09: 75 mg via INTRAVENOUS

## 2021-07-09 MED ORDER — LIDOCAINE HCL 1 % IJ SOLN
INTRAMUSCULAR | Status: DC | PRN
  Administered 2021-07-09: 50 mg via INTRADERMAL

## 2021-07-09 MED ORDER — PROPOFOL 500 MG/50ML IV EMUL
INTRAVENOUS | Status: DC | PRN
  Administered 2021-07-09: 150 ug/kg/min via INTRAVENOUS

## 2021-07-09 MED ORDER — LACTATED RINGERS IV SOLN: INTRAVENOUS | Status: DC

## 2021-07-09 NOTE — Discharge Instructions (Signed)
  Colonoscopy Discharge Instructions  Read the instructions outlined below and refer to this sheet in the next few weeks. These discharge instructions provide you with general information on caring for yourself after you leave the hospital. Your doctor may also give you specific instructions. While your treatment has been planned according to the most current medical practices available, unavoidable complications occasionally occur. If you have any problems or questions after discharge, call Dr. Jena Gauss at [phone]. ACTIVITY You may resume your regular activity, but move at a slower pace for the next 24 hours.  Take frequent rest periods for the next 24 hours.  Walking will help get rid of the air and reduce the bloated feeling in your belly (abdomen).  No driving for 24 hours (because of the medicine (anesthesia) used during the test).   Do not sign any important legal documents or operate any machinery for 24 hours (because of the anesthesia used during the test).  NUTRITION Drink plenty of fluids.  You may resume your normal diet as instructed by your doctor.  Begin with a light meal and progress to your normal diet. Heavy or fried foods are harder to digest and may make you feel sick to your stomach (nauseated).  Avoid alcoholic beverages for 24 hours or as instructed.  MEDICATIONS You may resume your normal medications unless your doctor tells you otherwise.  WHAT YOU CAN EXPECT TODAY Some feelings of bloating in the abdomen.  Passage of more gas than usual.  Spotting of blood in your stool or on the toilet paper.  IF YOU HAD POLYPS REMOVED DURING THE COLONOSCOPY: No aspirin products for 7 days or as instructed.  No alcohol for 7 days or as instructed.  Eat a soft diet for the next 24 hours.  FINDING OUT THE RESULTS OF YOUR TEST Not all test results are available during your visit. If your test results are not back during the visit, make an appointment with your caregiver to find out the  results. Do not assume everything is normal if you have not heard from your caregiver or the medical facility. It is important for you to follow up on all of your test results.  SEEK IMMEDIATE MEDICAL ATTENTION IF: You have more than a spotting of blood in your stool.  Your belly is swollen (abdominal distention).  You are nauseated or vomiting.  You have a temperature over 101.  You have abdominal pain or discomfort that is severe or gets worse throughout the day.     Diverticulosis only found today  I recommend 1 more screening colonoscopy in 10 years  At patient request, I called Lawson Fiscal [phone] findings and recommendations

## 2021-07-09 NOTE — Anesthesia Preprocedure Evaluation (Addendum)
Anesthesia Evaluation  Patient identified by MRN, date of birth, ID band Patient awake    Reviewed: Allergy & Precautions, NPO status , Patient's Chart, lab work & pertinent test results  Airway Mallampati: II  TM Distance: >3 FB Neck ROM: Full    Dental  (+) Dental Advisory Given, Missing, Loose, Poor Dentition, Chipped,    Pulmonary Current Smoker and Patient abstained from smoking.,    Pulmonary exam normal breath sounds clear to auscultation       Cardiovascular negative cardio ROS Normal cardiovascular exam Rhythm:Regular Rate:Normal     Neuro/Psych negative neurological ROS  negative psych ROS   GI/Hepatic negative GI ROS, (+)     substance abuse  alcohol use,   Endo/Other  negative endocrine ROS  Renal/GU negative Renal ROS  negative genitourinary   Musculoskeletal  (+) Arthritis , Osteoarthritis,    Abdominal   Peds negative pediatric ROS (+)  Hematology negative hematology ROS (+)   Anesthesia Other Findings   Reproductive/Obstetrics negative OB ROS                            Anesthesia Physical Anesthesia Plan  ASA: 2  Anesthesia Plan: General   Post-op Pain Management: Minimal or no pain anticipated   Induction: Intravenous  PONV Risk Score and Plan: Propofol infusion  Airway Management Planned: Nasal Cannula and Natural Airway  Additional Equipment:   Intra-op Plan:   Post-operative Plan:   Informed Consent: I have reviewed the patients History and Physical, chart, labs and discussed the procedure including the risks, benefits and alternatives for the proposed anesthesia with the patient or authorized representative who has indicated his/her understanding and acceptance.     Dental advisory given  Plan Discussed with: CRNA and Surgeon  Anesthesia Plan Comments:         Anesthesia Quick Evaluation

## 2021-07-09 NOTE — H&P (Signed)
@LOGO @   Primary Care Physician:  Pllc, [facility] Primary Gastroenterologist:  Dr. [facility]  Pre-Procedure History & Physical: HPI:  Colin Ellis is a 61 y.o. male here for diagnostic colonoscopy due to change in bowel habits.  Chronic diarrhea most of this year.  Has not had a colonoscopy 11 years. States over the past 3 months diarrhea steadily improved.  States she is really not having diarrhea anymore.  Past Medical History:  Diagnosis Date   ADH disorder ([facility])    Arthritis    Electrical injury in adult    Electrician by trade, "several shocks during working career"    Psoriasis    Teeth missing due to trauma     Past Surgical History:  Procedure Laterality Date   BACK SURGERY     herniated disc   COLONOSCOPY  11/12/2010   Surgeon: 11/14/2010, MD;  pancolonic diverticulosis, otherwise normal exam.    Prior to Admission medications   Medication Sig Start Date End Date Taking? Authorizing Provider  Multiple Vitamins-Minerals (MULTIVITAMIN WITH MINERALS) tablet Take 1 tablet by mouth daily.   Yes [provider]  polyethylene glycol-electrolytes (NULYTELY) 420 g solution As directed 06/08/21  Yes Lafawn Lenoir, 06/10/21, MD  clobetasol ointment (TEMOVATE) 0.05 % Apply 1 application topically 2 (two) times daily. Patient taking differently: Apply 1 application  topically 2 (two) times daily as needed (psoriasis). 01/22/21   03/22/21, PA-C  Coal Tar Extract [phone] PSORIASIS COAL TAR) 2 % GEL Apply 1 Application topically daily as needed (psoriasis).    [provider]  dicyclomine (BENTYL) 10 MG capsule Take 1 capsule (10 mg total) by mouth 3 (three) times daily before meals. Patient not taking: Reported on 07/02/2021 06/08/21   06/10/21, PA-C  FIBER COMPLETE PO Take 1 Capful by mouth daily.    [provider]    Allergies as of 06/08/2021 - Review Complete 06/08/2021  Allergen Reaction Noted   Sulfa antibiotics Nausea And Vomiting  10/16/2010    Family History  Problem Relation Age of Onset   Colon cancer Paternal Grandfather        70s or 60s   Anesthesia problems Neg Hx    Hypotension Neg Hx    Malignant hyperthermia Neg Hx    Pseudochol deficiency Neg Hx     Social History   Socioeconomic History   Marital status: Married    Spouse name: Not on file   Number of children: Not on file   Years of education: Not on file   Highest education level: Not on file  Occupational History   Not on file  Tobacco Use   Smoking status: Every Day    Packs/day: 1.00    Years: 40.00    Total pack years: 40.00    Types: Cigarettes   Smokeless tobacco: Never  Vaping Use   Vaping Use: Never used  Substance and Sexual Activity   Alcohol use: Yes    Alcohol/week: 4.0 standard drinks of alcohol    Types: 4 Cans of beer per week    Comment: 3-4 beers a night   Drug use: No   Sexual activity: Not on file  Other Topics Concern   Not on file  Social History Narrative   Not on file   Social Determinants of Health   Financial Resource Strain: Not on file  Food Insecurity: Not on file  Transportation Needs: Not on file  Physical Activity: Not on file  Stress: Not  on file  Social Connections: Not on file  Intimate Partner Violence: Not on file    Review of Systems: See HPI, otherwise negative ROS  Physical Exam: BP (!) 174/88   Temp 99.4 F (37.4 C) (Oral)   Resp (!) 21   Ht 6' (1.829 m)   Wt 68 kg   SpO2 99%   BMI 20.34 kg/m  General:   Alert,  Well-developed, well-nourished, pleasant and cooperative in NAD Neck:  Supple; no masses or thyromegaly. No significant cervical adenopathy. Lungs:  Clear throughout to auscultation.   No wheezes, crackles, or rhonchi. No acute distress. Heart:  Regular rate and rhythm; no murmurs, clicks, rubs,  or gallops. Abdomen: Non-distended, normal bowel sounds.  Soft and nontender without appreciable mass or hepatosplenomegaly.  Pulses:  Normal pulses  noted. Extremities:  Without clubbing or edema.  Impression/Plan: 61 year old gentleman with recent change in bowel habits.  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 Its been 11 years since he had a colonoscopy.  I have offered the patient a colonoscopy today.  The risks, benefits, limitations, alternatives and imponderables have been reviewed with the patient. Questions have been answered. All parties are agreeable.     Notice: This dictation was prepared with Dragon dictation along with smaller phrase technology. Any transcriptional errors that result from this process are unintentional and may not be corrected upon review.

## 2021-07-09 NOTE — Op Note (Signed)
[facility] Patient Name: Colin Ellis Procedure Date: 07/09/2021 11:24 AM MRN: [id number] Date of Birth: 1960/02/09 Attending MD: Gennette Pac , MD CSN: [id number] Age: 61 Admit Type: Outpatient Procedure:                Colonoscopy Indications:              Change in bowel habits Providers:                Gennette Pac, MD, Angelica Ran, Pandora Leiter, Technician Referring MD:              Medicines:                Propofol per Anesthesia Complications:            No immediate complications. Estimated Blood Loss:     Estimated blood loss: none. Procedure:                Pre-Anesthesia Assessment:                           - Prior to the procedure, a History and Physical                            was performed, and patient medications and                            allergies were reviewed. The patient's tolerance of                            previous anesthesia was also reviewed. The risks                            and benefits of the procedure and the sedation                            options and risks were discussed with the patient.                            All questions were answered, and informed consent                            was obtained. Prior Anticoagulants: The patient has                            taken no previous anticoagulant or antiplatelet                            agents. ASA Grade Assessment: II - A patient with                            mild systemic disease. After reviewing the risks  and benefits, the patient was deemed in                            satisfactory condition to undergo the procedure.                           After obtaining informed consent, the colonoscope                            was passed under direct vision. Throughout the                            procedure, the patient's blood pressure, pulse, and                            oxygen saturations were  monitored continuously. The                            [phone] scope was introduced through the                            anus and advanced to the the cecum, identified by                            appendiceal orifice and ileocecal valve. The                            colonoscopy was performed without difficulty. The                            patient tolerated the procedure well. The quality                            of the bowel preparation was adequate. Scope In: 11:41:17 AM Scope Out: 11:57:00 AM Scope Withdrawal Time: 0 hours 7 minutes 48 seconds  Total Procedure Duration: 0 hours 15 minutes 43 seconds  Findings:      The perianal and digital rectal examinations were normal. Somewhat of a       redundant colon.      Scattered medium-mouthed diverticula were found in the entire colon.      The exam was otherwise without abnormality on direct and retroflexion       views. Impression:               - Diverticulosis in the entire examined colon.                            Redundant colon.                           - The examination was otherwise normal on direct                            and retroflexion views.                           -  No specimens collected. Moderate Sedation:      Moderate (conscious) sedation was personally administered by an       anesthesia professional. The following parameters were monitored: oxygen       saturation, heart rate, blood pressure, respiratory rate, EKG, adequacy       of pulmonary ventilation, and response to care. Recommendation:           - Patient has a contact number available for                            emergencies. The signs and symptoms of potential                            delayed complications were discussed with the                            patient. Return to normal activities tomorrow.                            Written discharge instructions were provided to the                            patient.                            - Resume previous diet.                           - Continue present medications.                           - Repeat colonoscopy in 10 years for screening                            purposes.                           - Return to GI office (date not yet determined). Procedure Code(s):        --- Professional ---                           [phone], Colonoscopy, flexible; diagnostic, including                            collection of specimen(s) by brushing or washing,                            when performed (separate procedure) Diagnosis Code(s):        --- Professional ---                           R19.4, Change in bowel habit                           K57.30, Diverticulosis of large intestine without  perforation or abscess without bleeding CPT copyright 2019 American Medical Association. All rights reserved. The codes documented in this report are preliminary and upon coder review may  be revised to meet current compliance requirements. Gerrit Friends. Cher Egnor, MD Gennette Pac, MD 07/09/2021 12:03:22 PM This report has been signed electronically. Number of Addenda: 0

## 2021-07-09 NOTE — Anesthesia Postprocedure Evaluation (Signed)
Anesthesia Post Note  Patient: Colin Ellis  Procedure(s) Performed: COLONOSCOPY WITH PROPOFOL  Patient location during evaluation: Endoscopy Anesthesia Type: General Level of consciousness: awake and alert Pain management: pain level controlled Vital Signs Assessment: post-procedure vital signs reviewed and stable Respiratory status: spontaneous breathing Cardiovascular status: blood pressure returned to baseline and stable Postop Assessment: no apparent nausea or vomiting Anesthetic complications: no   No notable events documented.   Last Vitals:  Vitals:   07/09/21 1122  BP: (!) 174/88  Resp: (!) 21  Temp: 37.4 C  SpO2: 99%    Last Pain:  Vitals:   07/09/21 1136  TempSrc:   PainSc: 1                  Meriel Kelliher

## 2021-07-09 NOTE — Transfer of Care (Signed)
Immediate Anesthesia Transfer of Care Note  Patient: Colin Ellis  Procedure(s) Performed: COLONOSCOPY WITH PROPOFOL  Patient Location: Short Stay  Anesthesia Type:General  Level of Consciousness: awake  Airway & Oxygen Therapy: Patient Spontanous Breathing  Post-op Assessment: Report given to RN  Post vital signs: Reviewed and stable  Last Vitals:  Vitals Value Taken Time  BP    Temp    Pulse    Resp    SpO2      Last Pain:  Vitals:   07/09/21 1136  TempSrc:   PainSc: 1       Patients Stated Pain Goal: 4 (07/09/21 1119)  Complications: No notable events documented.

## 2021-07-13 ENCOUNTER — Encounter (HOSPITAL_COMMUNITY): Payer: Self-pay | Admitting: Internal Medicine

## 2021-09-21 DIAGNOSIS — R079 Chest pain, unspecified: Secondary | ICD-10-CM | POA: Insufficient documentation

## 2021-10-18 ENCOUNTER — Other Ambulatory Visit: Payer: Self-pay

## 2021-10-18 ENCOUNTER — Emergency Department (HOSPITAL_COMMUNITY)
Admission: EM | Admit: 2021-10-18 | Discharge: 2021-10-18 | Disposition: A | Discharge status: Home / Self Care | Payer: Commercial Managed Care - PPO | Attending: Emergency Medicine | Admitting: Emergency Medicine

## 2021-10-18 ENCOUNTER — Emergency Department (HOSPITAL_COMMUNITY): Payer: Commercial Managed Care - PPO

## 2021-10-18 ENCOUNTER — Encounter (HOSPITAL_COMMUNITY): Payer: Self-pay | Admitting: *Deleted

## 2021-10-18 DIAGNOSIS — Z87891 Personal history of nicotine dependence: Secondary | ICD-10-CM | POA: Diagnosis not present

## 2021-10-18 DIAGNOSIS — R079 Chest pain, unspecified: Secondary | ICD-10-CM | POA: Insufficient documentation

## 2021-10-18 DIAGNOSIS — R9431 Abnormal electrocardiogram [ECG] [EKG]: Secondary | ICD-10-CM | POA: Diagnosis not present

## 2021-10-18 LAB — BASIC METABOLIC PANEL
Anion gap: 13 (ref 5–15)
BUN: 13 mg/dL (ref 8–23)
CO2: 24 mmol/L (ref 22–32)
Calcium: 9.5 mg/dL (ref 8.9–10.3)
Chloride: 103 mmol/L (ref 98–111)
Creatinine, Ser: 0.97 mg/dL (ref 0.61–1.24)
GFR, Estimated: >60 mL/min (ref >60)
Glucose, Bld: 114 mg/dL — ABNORMAL HIGH (ref 70–99)
Potassium: 4.3 mmol/L (ref 3.5–5.1)
Sodium: 140 mmol/L (ref 135–145)

## 2021-10-18 LAB — CBC
HCT: 47.8 % (ref 39.0–52.0)
Hemoglobin: 16.5 g/dL (ref 13.0–17.0)
MCH: 33.7 pg (ref 26.0–34.0)
MCHC: 34.5 g/dL (ref 30.0–36.0)
MCV: 97.6 fL (ref 80.0–100.0)
Platelets: 229 10*3/uL (ref 150–400)
RBC: 4.9 MIL/uL (ref 4.22–5.81)
RDW: 12.1 % (ref 11.5–15.5)
WBC: 8.9 10*3/uL (ref 4.0–10.5)
nRBC: 0 % (ref 0.0–0.2)

## 2021-10-18 LAB — BRAIN NATRIURETIC PEPTIDE: B Natriuretic Peptide: 20 pg/mL (ref 0.0–100.0)

## 2021-10-18 LAB — TROPONIN I (HIGH SENSITIVITY)
Troponin I (High Sensitivity): 2 ng/L (ref <18)
Troponin I (High Sensitivity): <2 ng/L (ref <18)

## 2021-10-18 NOTE — ED Provider Notes (Signed)
[facility] EMERGENCY DEPARTMENT Provider Note   CSN: [id number] Arrival date & time: 10/18/21  1410     History  Chief Complaint  Patient presents with   Chest Pain    Colin Ellis is a 61 y.o. male.  HPI     61 year old male comes in with chief complaint of chest pain. Patient sent here by his PCP.  Patient has had chest pain for the last month.  He indicates that the chest pain has no specific trigger, aggravating or relieving factors.  Occasionally he has noted chest pain get worse while at work.  The pain is not always worse with any kind of exertion.  He denies any associated shortness of breath, nausea, diaphoresis.  Patient describes the pain as pressure type pain.  Occasionally will have tingling sensation over his left shoulder area and left neck.  No neck pain.  Patient denies any weakness in his upper extremity or any numbness or tingling that is constant in his upper extremity.  He saw his PCP, the started him on nitroglycerin and advised him to come to the ER for EKG.  Patient has no significant medical history but states that he smokes 1 to 2 packs a day.  Denies any alcohol use disorder.   Home Medications Prior to Admission medications   Medication Sig Start Date End Date Taking? Authorizing Provider  clobetasol ointment (TEMOVATE) [id number] % Apply 1 application topically 2 (two) times daily. Patient taking differently: Apply 1 application  topically 2 (two) times daily as needed (psoriasis). 01/22/21   Jaynee Eagles, PA-C  Coal Tar Extract [id number] PSORIASIS COAL TAR) 2 % GEL Apply 1 Application topically daily as needed (psoriasis).    [provider]  dicyclomine (BENTYL) 10 MG capsule Take 1 capsule (10 mg total) by mouth 3 (three) times daily before meals. Patient not taking: Reported on 07/02/2021 06/08/21   Erenest Rasher, PA-C  FIBER COMPLETE PO Take 1 Capful by mouth daily.    [provider]  Multiple Vitamins-Minerals (MULTIVITAMIN WITH  MINERALS) tablet Take 1 tablet by mouth daily.    [provider]  polyethylene glycol-electrolytes (NULYTELY) 420 g solution As directed 06/08/21   Rourk, Cristopher Estimable, MD      Allergies    Sulfa antibiotics    Review of Systems   Review of Systems  All other systems reviewed and are negative.   Physical Exam Updated Vital Signs BP (!) 165/91 (BP Location: Right Arm)   Pulse 86   Temp 98.6 F (37 C) (Oral)   Resp 18   Ht 5\' 10"  (1.778 m)   Wt 69 kg   SpO2 97%   BMI 21.83 kg/m  Physical Exam Vitals and nursing note reviewed.  Constitutional:      Appearance: He is well-developed.  HENT:     Head: Atraumatic.  Cardiovascular:     Rate and Rhythm: Normal rate.     Heart sounds: Normal heart sounds.  Pulmonary:     Effort: Pulmonary effort is normal.     Breath sounds: No decreased breath sounds.  Chest:     Chest wall: No mass or tenderness.  Abdominal:     General: There is no abdominal bruit.     Palpations: There is no mass.  Musculoskeletal:     Cervical back: Neck supple.  Skin:    General: Skin is warm.  Neurological:     Mental Status: He is alert and oriented to person, place, and time.  Comments: 2+ radial pulse bilaterally     ED Results / Procedures / Treatments   Labs (all labs ordered are listed, but only abnormal results are displayed) Labs Reviewed  BASIC METABOLIC PANEL - Abnormal; Notable for the following components:      Result Value   Glucose, Bld 114 (*)    All other components within normal limits  CBC  BRAIN NATRIURETIC PEPTIDE  TROPONIN I (HIGH SENSITIVITY)  TROPONIN I (HIGH SENSITIVITY)    EKG EKG Interpretation  Date/Time:  Thursday October 18 2021 14:23:42 EDT Ventricular Rate:  93 PR Interval:  154 QRS Duration: 86 QT Interval:  360 QTC Calculation: 447 R Axis:   59 Text Interpretation: Sinus rhythm with occasional and consecutive Premature ventricular complexes Possible Left atrial enlargement Septal  infarct , age undetermined Abnormal ECG When compared with ECG of 19-May-2017 14:01, Premature ventricular complexes are now Present Septal infarct is now Present QT has lengthened Confirmed by Varney Biles [phone]) on 10/18/2021 4:04:48 PM  Radiology DG Chest 2 View  Result Date: 10/18/2021 CLINICAL DATA:  chest pain EXAM: CHEST - 2 VIEW COMPARISON:  Chest x-ray May 19, 2017. FINDINGS: The heart size and mediastinal contours are within normal limits. Both lungs are clear. No visible pleural effusions or pneumothorax. No acute osseous abnormality. Partially imaged lumbar fixation hardware. IMPRESSION: No active cardiopulmonary disease. Electronically Signed   By: Margaretha Sheffield M.D.   On: 10/18/2021 14:46    Procedures Procedures    Medications Ordered in ED Medications - No data to display  ED Course/ Medical Decision Making/ A&P                           Medical Decision Making Amount and/or Complexity of Data Reviewed Labs: ordered. Radiology: ordered.   This patient presents to the ED with chief complaint(s) of chest pain that has been present for several weeks now, it is constant and there is associated tingling sensation occasionally with pertinent past medical history of tobacco use disorder.The complaint involves an extensive differential diagnosis and also carries with it a high risk of complications and morbidity.    Chest pain is left-sided, pressure-like but has been present constantly for the last 3 weeks.  No specific triggers, aggravating or relieving factors.  He occasionally will have tingling sensation that will go up to the left neck and left shoulder.  There is extensive tobacco use disorder history.  Patient's vascular exam is reassuring.  He has no neurodeficits.  No C-spine tenderness.  No meningismus.  No bruits appreciated and there is no palpable mass on abdominal exam.  Although an extensive differential diagnosis was considered including aortic  dissection, vascular thrombosis and acute coronary syndrome, suspicion for acute emergent pathology is low.  Patient is EKG showing Q waves in the septal leads.  Delta troponins have been ordered, basic labs have been ordered.  Disposition per lab findings.  The differential diagnosis includes ACS,   chest wall pain, nerve impingement, thrombosis, dissection.   Independent labs interpretation:  The following labs were independently interpreted: Patient's high-sensitivity troponin to 2 or below institutional cutoff for myocardial injury.  Patient does not have any electrolyte abnormality, renal failure.  Hemodynamics have stayed stable.  No hypertension, no tachycardia. Xray of the chest interpreted independently.  No large pleural effusion, no widened mediastinum..   Treatment and Reassessment: Results of the ER work-up discussed with the patient.  I made him aware that his troponins  are normal, but his EKG does have some nonspecific changes.  Given his tobacco use disorder, I think it would be prudent for him to follow-up with cardiologist.  Patient agrees with the plan.  Smoking cessation plan discussed.  I advised him to come to the ER if he starts having severe chest pain, upper extremity pain, discoloration of the upper extremity, numbness or weakness of the upper extremity.  Smoking cessation instruction/counseling given:  counseled patient on the dangers of tobacco use, advised patient to stop smoking, and reviewed strategies to maximize success   Final Clinical Impression(s) / ED Diagnoses Final diagnoses:  Left-sided chest pain  Nonspecific abnormal electrocardiogram (ECG) (EKG)    Rx / DC Orders ED Discharge Orders          Ordered    Ambulatory referral to Cardiology       Comments: If you have not heard from the Cardiology office within the next 72 hours please call [phone].   10/18/21 [place], Elijah Michaelis, MD 10/18/21 2102

## 2021-10-18 NOTE — ED Triage Notes (Signed)
Pt c/o chest pain x one month that gets worse with exertion;

## 2021-10-18 NOTE — Discharge Instructions (Signed)
You were seen in the ER for chest pain. There are some changes to your ekg that does merit evaluation by cardiologist.  We have put in a referral for cardiology team to see you.  Please return to the ER if you have worsening chest pain, shortness of breath, pain radiating to your jaw, shoulder, or back, sweats or fainting. Otherwise see the Cardiologist or your primary care doctor as requested.

## 2021-10-18 NOTE — ED Provider Triage Note (Signed)
Emergency Medicine Provider Triage Evaluation Note  Colin Ellis , a 61 y.o. male  was evaluated in triage.  Please see separate complete H&P.   Varney Biles, MD 10/22/21 [phone]

## 2021-10-19 NOTE — Progress Notes (Signed)
CARDIOLOGY CONSULT NOTE       Patient ID: Colin Ellis MRN: [id number] DOB/AGE: 09-21-1960 61 y.o.  Admit date: (Not on file) Referring Physician: Kathrynn Humble [facility] Primary Physician: Alanson Puls [facility] Primary Cardiologist: New Reason for Consultation: Chest pain abnormal ECG  Active Problems:   * No active hospital problems. *   HPI:  61 y.o. referred by [facility] Dr Kathrynn Humble for chest pain and abnormal ECG Seen there 10/18/21 [facility] for a month. 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[place])    Arthritis    Electrical injury in adult    Clinical biochemist by trade, "several shocks during working career"    Psoriasis    Teeth missing due to trauma     Family History  Problem Relation Age of Onset   Colon cancer Paternal Grandfather        98s or 53s   Anesthesia problems Neg Hx    Hypotension Neg Hx    Malignant hyperthermia Neg Hx    Pseudochol deficiency Neg Hx     Social History   Socioeconomic History   Marital status: Married    Spouse name: Not on file   Number of children: Not on file   Years of education: Not on file   Highest education level: Not on file  Occupational History   Not on file  Tobacco Use   Smoking status: Every Day    Packs/day: 1.00    Years: 40.00    Total pack years:  40.00    Types: Cigarettes   Smokeless tobacco: Never  Vaping Use   Vaping Use: Never used  Substance and Sexual Activity   Alcohol use: Yes    Alcohol/week: 4.0 standard drinks of alcohol    Types: 4 Cans of beer per week    Comment: 3-4 beers a night   Drug use: No   Sexual activity: Not on file  Other Topics Concern   Not on file  Social History Narrative   Not on file   Social Determinants of Health   Financial Resource Strain: Not on file  Food Insecurity: Not on file  Transportation Needs: Not on file  Physical Activity: Not on file  Stress: Not on file  Social Connections: Not on file  Intimate Partner Violence: Not on file    Past Surgical History:  Procedure Laterality Date   BACK SURGERY     herniated disc   COLONOSCOPY  11/12/2010   Surgeon: Daneil Dolin, MD;  pancolonic diverticulosis, otherwise normal exam.   COLONOSCOPY WITH PROPOFOL N/A 07/09/2021   Procedure: COLONOSCOPY WITH PROPOFOL;  Surgeon: Daneil Dolin, MD;  Location: [facility]ITE;  Service: Endoscopy;  Laterality: N/A;  12:45pm      Current Outpatient Medications:  clobetasol ointment (TEMOVATE) 0.05 %, Apply 1 application topically 2 (two) times daily. (Patient taking differently: Apply 1 application  topically 2 (two) times daily as needed (psoriasis).), Disp: 60 g, Rfl: 0   Coal Tar Extract (MG217 PSORIASIS COAL TAR) 2 % GEL, Apply 1 Application topically daily as needed (psoriasis)., Disp: , Rfl:    dicyclomine (BENTYL) 10 MG capsule, Take 1 capsule (10 mg total) by mouth 3 (three) times daily before meals. (Patient not taking: Reported on 07/02/2021), Disp: 90 capsule, Rfl: 1   FIBER COMPLETE PO, Take 1 Capful by mouth daily., Disp: , Rfl:    Multiple Vitamins-Minerals (MULTIVITAMIN WITH MINERALS) tablet, Take 1 tablet by mouth daily., Disp: , Rfl:    polyethylene glycol-electrolytes (NULYTELY) 420 g solution, As directed, Disp: 4000 mL, Rfl: 0    Physical Exam: There were no vitals  taken for this visit.   Affect appropriate Chronically ill appearing male  HEENT: poor dentition  Neck supple with no adenopathy JVP normal no bruits no thyromegaly Lungs Left lung rhonchi with exp wheezing  Heart:  S1/S2 no murmur, no rub, gallop or click PMI normal Abdomen: benighn, BS positve, no tenderness, no AAA no bruit.  No HSM or HJR Distal pulses intact with no bruits No edema Neuro non-focal Skin warm and dry No muscular weakness    Labs:   Lab Results  Component Value Date   WBC 8.9 10/18/2021   HGB 16.5 10/18/2021   HCT 47.8 10/18/2021   MCV 97.6 10/18/2021   PLT 229 10/18/2021    Recent Labs  Lab 10/18/21 1451  NA 140  K 4.3  CL 103  CO2 24  BUN 13  CREATININE 0.97  CALCIUM 9.5  GLUCOSE 114*   No results found for: "CKTOTAL", "CKMB", "CKMBINDEX", "TROPONINI" No results found for: "CHOL" No results found for: "HDL" No results found for: "LDLCALC" No results found for: "TRIG" No results found for: "CHOLHDL" No results found for: "LDLDIRECT"    Radiology: DG Chest 2 View  Result Date: 10/18/2021 CLINICAL DATA:  chest pain EXAM: CHEST - 2 VIEW COMPARISON:  Chest x-ray May 19, 2017. FINDINGS: The heart size and mediastinal contours are within normal limits. Both lungs are clear. No visible pleural effusions or pneumothorax. No acute osseous abnormality. Partially imaged lumbar fixation hardware. IMPRESSION: No active cardiopulmonary disease. Electronically Signed   By: Feliberto Harts M.D.   On: 10/18/2021 14:46    EKG: SR ICRBBB normal    ASSESSMENT AND PLAN:  Chest Pain:  atypical ECG not worrisome Smoker with elevated BP in ER Shared decision making favor cardiac CTA and can over read lung fields as lung cancer screening  BP:   stable low salt diet Smoking:  counseled on cessation < 10 minutes has clinical emphysema Will visualize lung fields on CT concern that pain may be from LUL lung lesion ETOH:  abuse discussed more moderation     BMET  Lopressor 100 mg 2 hours before CT Cardiac CTA  F/U PRN pending study  Signed: Charlton Haws 10/19/2021, 11:29 AM

## 2021-10-29 ENCOUNTER — Telehealth: Payer: Self-pay | Admitting: Cardiovascular Disease

## 2021-10-29 ENCOUNTER — Encounter: Payer: Self-pay | Admitting: Cardiovascular Disease

## 2021-10-29 ENCOUNTER — Other Ambulatory Visit (HOSPITAL_COMMUNITY)
Admission: RE | Admit: 2021-10-29 | Discharge: 2021-10-29 | Disposition: A | Discharge status: Home / Self Care | Payer: Commercial Managed Care - PPO | Source: Ambulatory Visit | Attending: Cardiovascular Disease | Admitting: Cardiovascular Disease

## 2021-10-29 ENCOUNTER — Ambulatory Visit: Payer: Commercial Managed Care - PPO | Attending: Cardiovascular Disease | Admitting: Cardiovascular Disease

## 2021-10-29 VITALS — BP 140/62 | HR 80 | Ht 70.5 in | Wt 143.0 lb

## 2021-10-29 DIAGNOSIS — F1010 Alcohol abuse, uncomplicated: Secondary | ICD-10-CM | POA: Diagnosis not present

## 2021-10-29 DIAGNOSIS — R072 Precordial pain: Secondary | ICD-10-CM | POA: Insufficient documentation

## 2021-10-29 DIAGNOSIS — F17200 Nicotine dependence, unspecified, uncomplicated: Secondary | ICD-10-CM

## 2021-10-29 DIAGNOSIS — Z79899 Other long term (current) drug therapy: Secondary | ICD-10-CM | POA: Diagnosis present

## 2021-10-29 LAB — BASIC METABOLIC PANEL
Anion gap: 9 (ref 5–15)
BUN: 11 mg/dL (ref 8–23)
CO2: 24 mmol/L (ref 22–32)
Calcium: 9.1 mg/dL (ref 8.9–10.3)
Chloride: 109 mmol/L (ref 98–111)
Creatinine, Ser: 0.75 mg/dL (ref 0.61–1.24)
GFR, Estimated: >60 mL/min (ref >60)
Glucose, Bld: 80 mg/dL (ref 70–99)
Potassium: 4.3 mmol/L (ref 3.5–5.1)
Sodium: 142 mmol/L (ref 135–145)

## 2021-10-29 MED ORDER — METOPROLOL TARTRATE 100 MG PO TABS: 100.0000 mg | ORAL_TABLET | ORAL | 0 refills | Status: DC

## 2021-10-29 NOTE — Telephone Encounter (Signed)
Error

## 2021-10-29 NOTE — Patient Instructions (Addendum)
Medication Instructions:   Take Lopressor 100 mg 2 hour prior to CT Scan   *If you need a refill on your cardiac medications before your next appointment, please call your pharmacy*   Lab Work: Your physician recommends that you return for lab work in: Today   If you have labs (blood work) drawn today and your tests are completely normal, you will receive your results only by: [place] (if you have MyChart) OR A paper copy in the mail If you have any lab test that is abnormal or we need to change your treatment, we will call you to review the results.   Testing/Procedures:   Your cardiac CT will be scheduled at one of the below locations:   [facility] [address] [place], [place] [address] (336) [place] [address] [place], [place] [address] [phone]  [facility] [place], [place] [address] [phone]  If scheduled at [facility], please arrive at the [facility] and Children's Entrance (Entrance C2) of [facility] 30 minutes prior to test start time. You can use the FREE valet parking offered at entrance C (encouraged to control the heart rate for the test)  Proceed to the [facility] Radiology Department (first floor) to check-in and test prep.  All radiology patients and guests should use entrance C2 at [facility], accessed from [facility], even though the hospital's physical address listed is [address]    If scheduled at [facility] or [facility], please arrive 15 mins early for check-in and test prep.   Please follow these instructions carefully (unless otherwise directed):  Hold all erectile dysfunction medications at least 3 days (72 hrs) prior to test. (Ie viagra, cialis, sildenafil, tadalafil, etc) We will administer  nitroglycerin during this exam.   On the Night Before the Test: Be sure to Drink plenty of water. Do not consume any caffeinated/decaffeinated beverages or chocolate 12 hours prior to your test. Do not take any antihistamines 12 hours prior to your test.  On the Day of the Test: Drink plenty of water until 1 hour prior to the test. Do not eat any food 1 hour prior to test. You may take your regular medications prior to the test.  Take metoprolol (Lopressor) two hours prior to test. HOLD Furosemide/Hydrochlorothiazide morning of the test.   *For Clinical Staff only. Please instruct patient the following:* Heart Rate Medication Recommendations for Cardiac CT  Resting HR < 50 bpm  No medication  Resting HR 50-60 bpm and BP >110/50 mmHG   Consider Metoprolol tartrate 25 mg PO 90-120 min prior to scan  Resting HR 60-65 bpm and BP >110/50 mmHG  Metoprolol tartrate 50 mg PO 90-120 minutes prior to scan   Resting HR > 65 bpm and BP >110/50 mmHG  Metoprolol tartrate 100 mg PO 90-120 minutes prior to scan  Consider Ivabradine 10-15 mg PO or a calcium channel blocker for resting HR >60 bpm and contraindication to metoprolol tartrate  Consider Ivabradine 10-15 mg PO in combination with metoprolol tartrate for HR >80 bpm         After the Test: Drink plenty of water. After receiving IV contrast, you may experience a mild flushed feeling. This is normal. On occasion, you may experience a mild rash up to 24 hours after the test. This is not dangerous.  If this occurs, you can take Benadryl 25 mg and increase your fluid intake. If you experience trouble breathing, this can be serious. If it is severe call 911 IMMEDIATELY. If it is mild, please call our office. If you take any of these medications: Glipizide/Metformin, Avandament, Glucavance, please do not take 48 hours after completing test unless otherwise instructed.  We will call to schedule your test 2-4 weeks out understanding that some  insurance companies will need an authorization prior to the service being performed.   For non-scheduling related questions, please contact the cardiac imaging nurse navigator should you have any questions/concerns: Rockwell [place], Cardiac Imaging Nurse Navigator Larey Brick, Cardiac Imaging Nurse Navigator  Heart and Vascular Services Direct Office Dial: [phone]   For scheduling needs, including cancellations and rescheduling, please call [place], [phone].    Follow-Up: At [facility], you and your health needs are our priority.  As part of our continuing mission to provide you with exceptional heart care, we have created designated Provider Care Teams.  These Care Teams include your primary Cardiologist (physician) and Advanced Practice Providers (APPs -  Physician Assistants and Nurse Practitioners) who all work together to provide you with the care you need, when you need it.  We recommend signing up for the patient portal called "MyChart".  Sign up information is provided on this After Visit Summary.  MyChart is used to connect with patients for Virtual Visits (Telemedicine).  Patients are able to view lab/test results, encounter notes, upcoming appointments, etc.  Non-urgent messages can be sent to your provider as well.   To learn more about what you can do with MyChart, go to [email or web address].    Your next appointment:    After your CT Scan   The format for your next appointment:   In Person  Provider:   Charlton Haws, MD    Other Instructions Thank you for choosing [facility]!    Important Information About Sugar

## 2021-11-08 ENCOUNTER — Telehealth (HOSPITAL_COMMUNITY): Payer: Self-pay | Admitting: *Deleted

## 2021-11-08 NOTE — Telephone Encounter (Signed)
Attempted to call patient regarding upcoming cardiac CT appointment. °Left message on voicemail with name and callback number ° °Jaylin Benzel RN Navigator Cardiac Imaging ° Heart and Vascular Services °[phone] Office °[phone] Cell ° °

## 2021-11-09 ENCOUNTER — Ambulatory Visit (HOSPITAL_COMMUNITY): Admission: RE | Admit: 2021-11-09 | Payer: Commercial Managed Care - PPO | Source: Ambulatory Visit

## 2021-11-09 ENCOUNTER — Encounter (HOSPITAL_COMMUNITY): Payer: Self-pay

## 2021-11-19 ENCOUNTER — Telehealth (HOSPITAL_COMMUNITY): Payer: Self-pay | Admitting: *Deleted

## 2021-11-19 ENCOUNTER — Telehealth (HOSPITAL_COMMUNITY): Payer: Self-pay | Admitting: Emergency Medicine

## 2021-11-19 NOTE — Telephone Encounter (Signed)
Reaching out to patient to offer assistance regarding upcoming cardiac imaging study; pt verbalizes understanding of appt date/time, parking situation and where to check in, pre-test NPO status and medications ordered, and verified current allergies; name and call back number provided for further questions should they arise Colin Bond RN Navigator Cardiac Imaging [facility] [phone] office [phone] cell  Denes iv issues Arrival 230 WC entrance 100mg  metoprolol tartrate

## 2021-11-19 NOTE — Telephone Encounter (Signed)
Reaching out to patient to reschedule his CCTA due to lack of insurance. Left message on voicemail with name and number.  Gordy Clement RN Navigator Cardiac Imaging [facility] V[facility] Services [phone] Office [phone] Cell

## 2021-11-20 ENCOUNTER — Ambulatory Visit (HOSPITAL_COMMUNITY): Admission: RE | Admit: 2021-11-20 | Payer: Commercial Managed Care - PPO | Source: Ambulatory Visit

## 2021-11-23 ENCOUNTER — Ambulatory Visit (HOSPITAL_COMMUNITY)
Admission: RE | Admit: 2021-11-23 | Discharge: 2021-11-23 | Disposition: A | Discharge status: Home / Self Care | Payer: Commercial Managed Care - PPO | Source: Ambulatory Visit | Attending: Cardiovascular Disease | Admitting: Cardiovascular Disease

## 2021-11-23 DIAGNOSIS — R072 Precordial pain: Secondary | ICD-10-CM | POA: Insufficient documentation

## 2021-11-23 MED ORDER — IOHEXOL 350 MG/ML SOLN
100.0000 mL | Freq: Once | INTRAVENOUS | Status: AC | PRN
  Administered 2021-11-23: 100 mL via INTRAVENOUS

## 2021-11-23 MED ORDER — NITROGLYCERIN 0.4 MG SL SUBL
0.8000 mg | SUBLINGUAL_TABLET | Freq: Once | SUBLINGUAL | Status: AC
  Administered 2021-11-23: 0.8 mg via SUBLINGUAL

## 2021-11-23 MED ORDER — NITROGLYCERIN 0.4 MG SL SUBL
SUBLINGUAL_TABLET | SUBLINGUAL | Status: AC
  Filled 2021-11-23: qty 2

## 2021-12-28 ENCOUNTER — Emergency Department (HOSPITAL_COMMUNITY): Payer: Commercial Managed Care - PPO

## 2021-12-28 ENCOUNTER — Other Ambulatory Visit: Payer: Self-pay

## 2021-12-28 ENCOUNTER — Encounter (HOSPITAL_COMMUNITY): Payer: Self-pay

## 2021-12-28 ENCOUNTER — Emergency Department (HOSPITAL_COMMUNITY)
Admission: EM | Admit: 2021-12-28 | Discharge: 2021-12-29 | Disposition: A | Discharge status: Home / Self Care | Payer: Commercial Managed Care - PPO | Attending: Emergency Medicine | Admitting: Emergency Medicine

## 2021-12-28 DIAGNOSIS — Y92410 Unspecified street and highway as the place of occurrence of the external cause: Secondary | ICD-10-CM | POA: Diagnosis not present

## 2021-12-28 DIAGNOSIS — S3210XA Unspecified fracture of sacrum, initial encounter for closed fracture: Secondary | ICD-10-CM | POA: Diagnosis not present

## 2021-12-28 DIAGNOSIS — S8011XA Contusion of right lower leg, initial encounter: Secondary | ICD-10-CM | POA: Insufficient documentation

## 2021-12-28 DIAGNOSIS — S0191XA Laceration without foreign body of unspecified part of head, initial encounter: Secondary | ICD-10-CM | POA: Diagnosis present

## 2021-12-28 DIAGNOSIS — S0101XA Laceration without foreign body of scalp, initial encounter: Secondary | ICD-10-CM | POA: Insufficient documentation

## 2021-12-28 DIAGNOSIS — Z7982 Long term (current) use of aspirin: Secondary | ICD-10-CM | POA: Insufficient documentation

## 2021-12-28 DIAGNOSIS — Z23 Encounter for immunization: Secondary | ICD-10-CM | POA: Diagnosis not present

## 2021-12-28 DIAGNOSIS — Z79899 Other long term (current) drug therapy: Secondary | ICD-10-CM | POA: Insufficient documentation

## 2021-12-28 DIAGNOSIS — S82302A Unspecified fracture of lower end of left tibia, initial encounter for closed fracture: Secondary | ICD-10-CM | POA: Diagnosis not present

## 2021-12-28 DIAGNOSIS — S0003XA Contusion of scalp, initial encounter: Secondary | ICD-10-CM

## 2021-12-28 LAB — CBC
HCT: 42.2 % (ref 39.0–52.0)
Hemoglobin: 14.4 g/dL (ref 13.0–17.0)
MCH: 33.9 pg (ref 26.0–34.0)
MCHC: 34.1 g/dL (ref 30.0–36.0)
MCV: 99.3 fL (ref 80.0–100.0)
Platelets: 196 10*3/uL (ref 150–400)
RBC: 4.25 MIL/uL (ref 4.22–5.81)
RDW: 11.8 % (ref 11.5–15.5)
WBC: 7.1 10*3/uL (ref 4.0–10.5)
nRBC: 0 % (ref 0.0–0.2)

## 2021-12-28 LAB — I-STAT CHEM 8, ED
BUN: 13 mg/dL (ref 8–23)
Calcium, Ion: 1.05 mmol/L — ABNORMAL LOW (ref 1.15–1.40)
Chloride: 105 mmol/L (ref 98–111)
Creatinine, Ser: 1 mg/dL (ref 0.61–1.24)
Glucose, Bld: 87 mg/dL (ref 70–99)
HCT: 45 % (ref 39.0–52.0)
Hemoglobin: 15.3 g/dL (ref 13.0–17.0)
Potassium: 3.9 mmol/L (ref 3.5–5.1)
Sodium: 140 mmol/L (ref 135–145)
TCO2: 22 mmol/L (ref 22–32)

## 2021-12-28 LAB — COMPREHENSIVE METABOLIC PANEL
ALT: 28 U/L (ref 0–44)
AST: 36 U/L (ref 15–41)
Albumin: 4.1 g/dL (ref 3.5–5.0)
Alkaline Phosphatase: 46 U/L (ref 38–126)
Anion gap: 14 (ref 5–15)
BUN: 13 mg/dL (ref 8–23)
CO2: 23 mmol/L (ref 22–32)
Calcium: 8.9 mg/dL (ref 8.9–10.3)
Chloride: 104 mmol/L (ref 98–111)
Creatinine, Ser: 0.78 mg/dL (ref 0.61–1.24)
GFR, Estimated: >60 mL/min (ref >60)
Glucose, Bld: 91 mg/dL (ref 70–99)
Potassium: 3.7 mmol/L (ref 3.5–5.1)
Sodium: 141 mmol/L (ref 135–145)
Total Bilirubin: 0.5 mg/dL (ref 0.3–1.2)
Total Protein: 6.8 g/dL (ref 6.5–8.1)

## 2021-12-28 LAB — PROTIME-INR
INR: 1 (ref 0.8–1.2)
Prothrombin Time: 12.9 seconds (ref 11.4–15.2)

## 2021-12-28 LAB — APTT: aPTT: 25 seconds (ref 24–36)

## 2021-12-28 MED ORDER — TETANUS-DIPHTH-ACELL PERTUSSIS 5-2.5-18.5 LF-MCG/0.5 IM SUSY
0.5000 mL | PREFILLED_SYRINGE | Freq: Once | INTRAMUSCULAR | Status: AC
  Administered 2021-12-28: 0.5 mL via INTRAMUSCULAR
  Filled 2021-12-28: qty 0.5

## 2021-12-28 MED ORDER — ZIPRASIDONE MESYLATE 20 MG IM SOLR
INTRAMUSCULAR | Status: AC
  Filled 2021-12-28: qty 20

## 2021-12-28 MED ORDER — LIDOCAINE-EPINEPHRINE-TETRACAINE (LET) TOPICAL GEL
3.0000 mL | Freq: Once | TOPICAL | Status: AC
  Administered 2021-12-28: 3 mL via TOPICAL
  Filled 2021-12-28: qty 3

## 2021-12-28 MED ORDER — STERILE WATER FOR INJECTION IJ SOLN
INTRAMUSCULAR | Status: AC
  Filled 2021-12-28: qty 10

## 2021-12-28 MED ORDER — CEPHALEXIN 500 MG PO CAPS
500.0000 mg | ORAL_CAPSULE | Freq: Once | ORAL | Status: AC
  Administered 2021-12-28: 500 mg via ORAL
  Filled 2021-12-28: qty 1

## 2021-12-28 MED ORDER — FENTANYL CITRATE PF 50 MCG/ML IJ SOSY
50.0000 ug | PREFILLED_SYRINGE | Freq: Once | INTRAMUSCULAR | Status: AC
  Administered 2021-12-28: 50 ug via INTRAVENOUS
  Filled 2021-12-28: qty 1

## 2021-12-28 MED ORDER — IOHEXOL 300 MG/ML  SOLN
100.0000 mL | Freq: Once | INTRAMUSCULAR | Status: AC | PRN
  Administered 2021-12-28: 100 mL via INTRAVENOUS

## 2021-12-28 MED ORDER — CEPHALEXIN 500 MG PO CAPS: 500.0000 mg | ORAL_CAPSULE | Freq: Three times a day (TID) | ORAL | 0 refills | Status: AC

## 2021-12-28 MED ORDER — OXYCODONE-ACETAMINOPHEN 5-325 MG PO TABS: 1.0000 | ORAL_TABLET | Freq: Three times a day (TID) | ORAL | 0 refills | Status: DC | PRN

## 2021-12-28 MED ORDER — TRANEXAMIC ACID FOR EPISTAXIS
500.0000 mg | Freq: Once | TOPICAL | Status: AC
  Administered 2021-12-28: 500 mg via TOPICAL
  Filled 2021-12-28: qty 10

## 2021-12-28 NOTE — Progress Notes (Signed)
RT present at bedside

## 2021-12-28 NOTE — ED Notes (Signed)
Pressure dressing applied to the back of the head.
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
## 2021-12-29 NOTE — ED Provider Notes (Signed)
Patient is awake alert and trying to walk around.  He has a walking boot on.  I spoke to his wife Lawson Fiscal via the phone at length.  We discussed all of his imaging findings.  We discussed his follow-up plan.  We discussed wound care precautions.  Prescriptions have already been sent by previous provider.  She understands need to follow-up with orthopedics.  She also understands need to have staples removed   Zadie Rhine, MD 12/29/21 [phone]

## 2022-01-13 ENCOUNTER — Emergency Department (HOSPITAL_COMMUNITY)
Admission: EM | Admit: 2022-01-13 | Discharge: 2022-01-13 | Disposition: A | Discharge status: Home / Self Care | Payer: Commercial Managed Care - PPO | Attending: Emergency Medicine | Admitting: Emergency Medicine

## 2022-01-13 ENCOUNTER — Encounter (HOSPITAL_COMMUNITY): Payer: Self-pay | Admitting: Emergency Medicine

## 2022-01-13 ENCOUNTER — Other Ambulatory Visit: Payer: Self-pay

## 2022-01-13 DIAGNOSIS — Z7982 Long term (current) use of aspirin: Secondary | ICD-10-CM | POA: Insufficient documentation

## 2022-01-13 DIAGNOSIS — Z79899 Other long term (current) drug therapy: Secondary | ICD-10-CM | POA: Insufficient documentation

## 2022-01-13 DIAGNOSIS — Z4802 Encounter for removal of sutures: Secondary | ICD-10-CM | POA: Diagnosis not present
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
## 2022-01-13 NOTE — Discharge Instructions (Signed)
Your wound appears to be healing well.  Follow-up with your primary care provider.  Return to the ER if needed.

## 2022-01-13 NOTE — ED Triage Notes (Signed)
Pt presents for staple removal from head, staple should have been removed over a week ago, but he couldn't come any sooner and know he needs a haircut.

## 2022-01-23 ENCOUNTER — Ambulatory Visit (INDEPENDENT_AMBULATORY_CARE_PROVIDER_SITE_OTHER): Payer: Commercial Managed Care - PPO | Admitting: Internal Medicine

## 2022-01-23 ENCOUNTER — Encounter: Payer: Self-pay | Admitting: Internal Medicine

## 2022-01-23 VITALS — BP 122/71 | HR 90 | Resp 16 | Ht 70.5 in | Wt 159.0 lb

## 2022-01-23 DIAGNOSIS — Z0001 Encounter for general adult medical examination with abnormal findings: Secondary | ICD-10-CM

## 2022-01-23 DIAGNOSIS — J449 Chronic obstructive pulmonary disease, unspecified: Secondary | ICD-10-CM

## 2022-01-23 MED ORDER — SPIRIVA RESPIMAT 2.5 MCG/ACT IN AERS: 2.0000 | INHALATION_SPRAY | Freq: Every day | RESPIRATORY_TRACT | 0 refills | Status: DC

## 2022-01-23 MED ORDER — ALBUTEROL SULFATE HFA 108 (90 BASE) MCG/ACT IN AERS: 2.0000 | INHALATION_SPRAY | Freq: Four times a day (QID) | RESPIRATORY_TRACT | 2 refills | Status: DC | PRN
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
## 2022-01-23 NOTE — Patient Instructions (Signed)
Thank you for trusting me with your care. To recap, today we discussed the following:    Encounter for general adult medical examination  Obtain fasting labs at your convenience - Lipid panel - CMP14+EGFR  COPD suggested by initial evaluation  - Tiotropium Bromide Monohydrate (SPIRIVA RESPIMAT) 2.5 MCG/ACT AERS; Inhale 2 puffs into the lungs daily.  Dispense: 12 g; Refill: 0 - albuterol (VENTOLIN HFA) 108 (90 Base) MCG/ACT inhaler; Inhale 2 puffs into the lungs every 6 (six) hours as needed for wheezing or shortness of breath.  Dispense: 8 g; Refill: 2 -We can send you for pulmonary function test in future

## 2022-01-26 DIAGNOSIS — J449 Chronic obstructive pulmonary disease, unspecified: Secondary | ICD-10-CM | POA: Insufficient documentation

## 2022-01-26 DIAGNOSIS — Z0001 Encounter for general adult medical examination with abnormal findings: Secondary | ICD-10-CM | POA: Insufficient documentation

## 2022-01-26 NOTE — Assessment & Plan Note (Signed)
-    Lipid panel - CMP14+EGFR

## 2022-01-26 NOTE — Assessment & Plan Note (Addendum)
LAMA inhaler prescribed for daily use. Albuterol PRN for shortness of breath. Not ready to quit smoking at this time. Will consider PFT's in future, but would not like to be further evaluated at this time.Emphysema noted on review of previous CT Chest 12/2021  - Tiotropium Bromide Monohydrate (SPIRIVA RESPIMAT) 2.5 MCG/ACT AERS; Inhale 2 puffs into the lungs daily.  Dispense: 12 g; Refill: 0 - albuterol (VENTOLIN HFA) 108 (90 Base) MCG/ACT inhaler; Inhale 2 puffs into the lungs every 6 (six) hours as needed for wheezing or shortness of breath.  Dispense: 8 g; Refill: 2

## 2022-01-29 ENCOUNTER — Encounter: Payer: Self-pay | Admitting: Internal Medicine

## 2022-01-30 NOTE — Telephone Encounter (Signed)
Seen, thank you.  

## 2022-01-31 ENCOUNTER — Ambulatory Visit (INDEPENDENT_AMBULATORY_CARE_PROVIDER_SITE_OTHER): Payer: Commercial Managed Care - PPO | Admitting: Internal Medicine

## 2022-01-31 ENCOUNTER — Encounter: Payer: Self-pay | Admitting: Internal Medicine

## 2022-01-31 VITALS — BP 136/82 | HR 82 | Ht 70.5 in | Wt 154.6 lb

## 2022-01-31 DIAGNOSIS — S82302K Unspecified fracture of lower end of left tibia, subsequent encounter for closed fracture with nonunion: Secondary | ICD-10-CM

## 2022-01-31 DIAGNOSIS — Z981 Arthrodesis status: Secondary | ICD-10-CM | POA: Diagnosis not present

## 2022-01-31 DIAGNOSIS — S3210XD Unspecified fracture of sacrum, subsequent encounter for fracture with routine healing: Secondary | ICD-10-CM | POA: Diagnosis not present

## 2022-01-31 DIAGNOSIS — S0003XD Contusion of scalp, subsequent encounter: Secondary | ICD-10-CM

## 2022-01-31 DIAGNOSIS — S0003XA Contusion of scalp, initial encounter: Secondary | ICD-10-CM | POA: Insufficient documentation

## 2022-01-31 MED ORDER — TRAMADOL HCL 50 MG PO TABS: 50.0000 mg | ORAL_TABLET | Freq: Three times a day (TID) | ORAL | 0 refills | Status: DC | PRN

## 2022-01-31 MED ORDER — MELOXICAM 7.5 MG PO TABS: 7.5000 mg | ORAL_TABLET | Freq: Every day | ORAL | 0 refills | Status: DC

## 2022-01-31 NOTE — Assessment & Plan Note (Addendum)
Still has episodes of confusion, headache, dizziness and visual disturbance Also has had emotional disturbance Get CT head without contrast STAT - concern for persistent hematoma or vasogenic edema Work note for 2 weeks, planning to apply for short-term disability - advised to submit paperwork to office

## 2022-01-31 NOTE — Patient Instructions (Signed)
Please take Mobic as needed for pain.  Please get CT of head done as scheduled.  Please get insurance paperwork submitted to the office.
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
## 2022-01-31 NOTE — Assessment & Plan Note (Signed)
Has severe leg pain X 1 month X-ray of tibia/fibula showed a fracture of tibia Tramadol PRN for severe pain Mobic PRN for mild-moderate pain Urgent referral to Orthopedic surgeon

## 2022-01-31 NOTE — Assessment & Plan Note (Signed)
Has history of lumbar spinal fusion Recently had sacral fracture, but currently denies any recent worsening of back pain
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
## 2022-02-04 ENCOUNTER — Telehealth: Payer: Self-pay

## 2022-02-04 ENCOUNTER — Ambulatory Visit (HOSPITAL_COMMUNITY)
Admission: RE | Admit: 2022-02-04 | Discharge: 2022-02-04 | Disposition: A | Discharge status: Home / Self Care | Payer: Commercial Managed Care - PPO | Source: Ambulatory Visit | Attending: Internal Medicine | Admitting: Internal Medicine

## 2022-02-04 DIAGNOSIS — S0003XD Contusion of scalp, subsequent encounter: Secondary | ICD-10-CM | POA: Diagnosis present

## 2022-02-04 NOTE — Telephone Encounter (Signed)
Wife advised. 

## 2022-02-05 ENCOUNTER — Ambulatory Visit: Payer: Commercial Managed Care - PPO | Admitting: Orthopedic Surgery

## 2022-02-12 ENCOUNTER — Encounter: Payer: Self-pay | Admitting: Internal Medicine

## 2022-02-14 ENCOUNTER — Encounter: Payer: Self-pay | Admitting: Internal Medicine

## 2022-02-14 ENCOUNTER — Ambulatory Visit: Payer: Commercial Managed Care - PPO | Admitting: Internal Medicine

## 2022-02-19 ENCOUNTER — Encounter: Payer: Self-pay | Admitting: Internal Medicine

## 2022-02-19 ENCOUNTER — Ambulatory Visit (INDEPENDENT_AMBULATORY_CARE_PROVIDER_SITE_OTHER): Payer: Commercial Managed Care - PPO | Admitting: Internal Medicine

## 2022-02-19 VITALS — BP 144/86 | HR 86 | Ht 70.5 in | Wt 154.8 lb

## 2022-02-19 DIAGNOSIS — Z1159 Encounter for screening for other viral diseases: Secondary | ICD-10-CM

## 2022-02-19 DIAGNOSIS — Z0279 Encounter for issue of other medical certificate: Secondary | ICD-10-CM

## 2022-02-19 DIAGNOSIS — Z114 Encounter for screening for human immunodeficiency virus [HIV]: Secondary | ICD-10-CM

## 2022-02-19 DIAGNOSIS — S0003XD Contusion of scalp, subsequent encounter: Secondary | ICD-10-CM | POA: Diagnosis not present

## 2022-02-19 DIAGNOSIS — I10 Essential (primary) hypertension: Secondary | ICD-10-CM

## 2022-02-19 DIAGNOSIS — S82302K Unspecified fracture of lower end of left tibia, subsequent encounter for closed fracture with nonunion: Secondary | ICD-10-CM

## 2022-02-19 DIAGNOSIS — S3210XD Unspecified fracture of sacrum, subsequent encounter for fracture with routine healing: Secondary | ICD-10-CM

## 2022-02-19 MED ORDER — LOSARTAN POTASSIUM 25 MG PO TABS: 25.0000 mg | ORAL_TABLET | Freq: Every day | ORAL | 3 refills | Status: DC

## 2022-02-19 NOTE — Assessment & Plan Note (Signed)
Episodes of confusion, headache, dizziness and visual disturbance - resolved now, was likely due to concussion injury Also has had emotional disturbance Repeat CT head without contrast - resolution of hematoma Applied for short-term disability - paperwork filled out Since resolution of most of the neurological symptoms with benign neurological exam, return to work note provided.

## 2022-02-19 NOTE — Assessment & Plan Note (Signed)
BP Readings from Last 1 Encounters:  02/19/22 (!) 144/86   New onset Started Losartan 25 mg QD Counseled for compliance with the medications Advised DASH diet and moderate exercise/walking, at least 150 mins/week

## 2022-02-19 NOTE — Assessment & Plan Note (Signed)
Has severe leg pain X 1 month, now resolved X-ray of tibia/fibula showed a fracture of tibia Tramadol PRN for severe pain Mobic PRN for mild-moderate pain Urgent referral to Orthopedic surgeon, but did not see them as his pain has resolved now - advised to get evaluation at least once
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
## 2022-02-19 NOTE — Patient Instructions (Signed)
Please start taking Losartan as prescribed.  Please continue to use Albuterol inhaler as needed for shortness of breath or wheezing.
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
## 2022-03-03 ENCOUNTER — Encounter: Payer: Self-pay | Admitting: Internal Medicine

## 2022-03-21 ENCOUNTER — Encounter: Payer: Self-pay | Admitting: Radiology

## 2022-04-20 LAB — HIV ANTIBODY (ROUTINE TESTING W REFLEX): HIV Screen 4th Generation wRfx: NONREACTIVE

## 2022-04-20 LAB — BASIC METABOLIC PANEL
BUN/Creatinine Ratio: 9 — ABNORMAL LOW (ref 10–24)
BUN: 7 mg/dL — ABNORMAL LOW (ref 8–27)
CO2: 21 mmol/L (ref 20–29)
Calcium: 9.7 mg/dL (ref 8.6–10.2)
Chloride: 101 mmol/L (ref 96–106)
Creatinine, Ser: 0.8 mg/dL (ref 0.76–1.27)
Glucose: 99 mg/dL (ref 70–99)
Potassium: 5.1 mmol/L (ref 3.5–5.2)
Sodium: 138 mmol/L (ref 134–144)
eGFR: 100 mL/min/{1.73_m2} (ref >59)

## 2022-04-20 LAB — HEPATITIS C ANTIBODY: Hep C Virus Ab: NONREACTIVE

## 2022-04-24 ENCOUNTER — Ambulatory Visit: Payer: Commercial Managed Care - PPO | Admitting: Internal Medicine

## 2022-05-13 ENCOUNTER — Encounter: Payer: Self-pay | Admitting: Internal Medicine

## 2022-05-17 ENCOUNTER — Encounter: Payer: Self-pay | Admitting: Internal Medicine

## 2022-05-17 ENCOUNTER — Ambulatory Visit (INDEPENDENT_AMBULATORY_CARE_PROVIDER_SITE_OTHER): Payer: Commercial Managed Care - PPO | Admitting: Internal Medicine

## 2022-05-17 VITALS — BP 152/91 | HR 67 | Ht 72.0 in | Wt 160.2 lb

## 2022-05-17 DIAGNOSIS — J449 Chronic obstructive pulmonary disease, unspecified: Secondary | ICD-10-CM | POA: Diagnosis not present

## 2022-05-17 DIAGNOSIS — R350 Frequency of micturition: Secondary | ICD-10-CM

## 2022-05-17 DIAGNOSIS — N401 Enlarged prostate with lower urinary tract symptoms: Secondary | ICD-10-CM | POA: Diagnosis not present

## 2022-05-17 DIAGNOSIS — N400 Enlarged prostate without lower urinary tract symptoms: Secondary | ICD-10-CM | POA: Insufficient documentation

## 2022-05-17 DIAGNOSIS — I10 Essential (primary) hypertension: Secondary | ICD-10-CM

## 2022-05-17 DIAGNOSIS — Z131 Encounter for screening for diabetes mellitus: Secondary | ICD-10-CM

## 2022-05-17 DIAGNOSIS — Z1329 Encounter for screening for other suspected endocrine disorder: Secondary | ICD-10-CM

## 2022-05-17 DIAGNOSIS — Z13220 Encounter for screening for lipoid disorders: Secondary | ICD-10-CM

## 2022-05-17 DIAGNOSIS — Z1321 Encounter for screening for nutritional disorder: Secondary | ICD-10-CM

## 2022-05-17 MED ORDER — ALFUZOSIN HCL ER 10 MG PO TB24: 10.0000 mg | ORAL_TABLET | Freq: Every day | ORAL | 2 refills | Status: DC

## 2022-05-17 NOTE — Patient Instructions (Signed)
It was a pleasure to see you today.  Thank you for giving [place] the opportunity to be involved in your care.  Below is a brief recap of your visit and next steps.  We will plan to see you again in 4 weeks.  Summary Try alfuzosin for relief of urinary symptoms Repeat labs ordered today Follow up in 4 weeks for BP check

## 2022-05-17 NOTE — Assessment & Plan Note (Signed)
Presenting today for HTN follow-up.  Losartan 25 mg daily was initiated at his last appointment in January.  Today he reports that he has not been taking losartan as previously prescribed.  BP remains elevated today, 152/91. -No medication changes today.  Recommend taking losartan as previously prescribed -Follow-up in 4 weeks for HTN check

## 2022-05-17 NOTE — Progress Notes (Signed)
Established Patient Office Visit  Subjective   Patient ID: Colin Ellis, male    DOB: 11-30-1960  Age: 62 y.o. MRN: [id number]  Chief Complaint  Patient presents with   Hypertension    Follow up. Patient states he has to urinate more frequently    Mr. Colin Ellis returns to care today for HTN follow-up.  He was last evaluated at [facility] on 1/30 by Dr. Allena Katz at which time losartan 25 mg daily was initiated for treatment of hypertension.  There have been no acute interval events. Mr. Colin Ellis acute concern today is nocturia and incomplete emptying of his bladder.  He states that this keeps him awake at night.  He is interested in treatment options today.  He has not been taking losartan as previously prescribed.  He has no additional concerns to discuss today  Past Medical History:  Diagnosis Date   ADH disorder    Arthritis    Electrical injury in adult    Electrician by trade, "several shocks during working career"    Psoriasis    Teeth missing due to trauma    Past Surgical History:  Procedure Laterality Date   BACK SURGERY     herniated disc   COLONOSCOPY  11/12/2010   Surgeon: Corbin Ade, MD;  pancolonic diverticulosis, otherwise normal exam.   COLONOSCOPY WITH PROPOFOL N/A 07/09/2021   Procedure: COLONOSCOPY WITH PROPOFOL;  Surgeon: Corbin Ade, MD;  [facility];  Service: Endoscopy;  Laterality: N/A;  12:45pm   Social History   Tobacco Use   Smoking status: Every Day    Packs/day: 1.00    Years: 40.00    Additional pack years: 0.00    Total pack years: 40.00    Types: Cigarettes   Smokeless tobacco: Never  Vaping Use   Vaping Use: Never used  Substance Use Topics   Alcohol use: Yes    Alcohol/week: 4.0 standard drinks of alcohol    Types: 4 Cans of beer per week    Comment: 3-4 beers a night   Drug use: No   Family History  Problem Relation Age of Onset   Colon cancer Paternal Grandfather        7s or 72s   Anesthesia problems Neg Hx     Hypotension Neg Hx    Malignant hyperthermia Neg Hx    Pseudochol deficiency Neg Hx    Allergies  Allergen Reactions   Sulfa Antibiotics Nausea And Vomiting    Upset stomach   Review of Systems  Constitutional:  Negative for chills and fever.  HENT:  Negative for sore throat.   Respiratory:  Negative for cough and shortness of breath.   Cardiovascular:  Negative for chest pain, palpitations and leg swelling.  Gastrointestinal:  Negative for abdominal pain, blood in stool, constipation, diarrhea, nausea and vomiting.  Genitourinary:  Positive for frequency. Negative for dysuria and hematuria.  Musculoskeletal:  Negative for myalgias.  Skin:  Negative for itching and rash.  Neurological:  Negative for dizziness and headaches.  Psychiatric/Behavioral:  Negative for depression and suicidal ideas.      Objective:     BP (!) 152/91   Pulse 67   Ht 6' (1.829 m)   Wt 160 lb 3.2 oz (72.7 kg)   SpO2 98%   BMI 21.73 kg/m  BP Readings from Last 3 Encounters:  05/17/22 (!) 152/91  02/19/22 (!) 144/86  01/31/22 136/82   Physical Exam Vitals reviewed.  Constitutional:  General: He is not in acute distress.    Appearance: Normal appearance. He is not ill-appearing.  HENT:     Head: Normocephalic and atraumatic.     Right Ear: External ear normal.     Left Ear: External ear normal.     Nose: Nose normal. No congestion or rhinorrhea.     Mouth/Throat:     Mouth: Mucous membranes are moist.     Pharynx: Oropharynx is clear.  Eyes:     General: No scleral icterus.    Extraocular Movements: Extraocular movements intact.     Conjunctiva/sclera: Conjunctivae normal.     Pupils: Pupils are equal, round, and reactive to light.  Cardiovascular:     Rate and Rhythm: Normal rate and regular rhythm.     Pulses: Normal pulses.     Heart sounds: Normal heart sounds. No murmur heard. Pulmonary:     Effort: Pulmonary effort is normal.     Breath sounds: Wheezing present. No rhonchi or  rales.  Abdominal:     General: Abdomen is flat. Bowel sounds are normal. There is no distension.     Palpations: Abdomen is soft.     Tenderness: There is no abdominal tenderness.  Musculoskeletal:        General: No swelling or deformity. Normal range of motion.     Cervical back: Normal range of motion.  Skin:    General: Skin is warm and dry.     Capillary Refill: Capillary refill takes less than 2 seconds.  Neurological:     General: No focal deficit present.     Mental Status: He is alert and oriented to person, place, and time.     Motor: No weakness.  Psychiatric:        Mood and Affect: Mood normal.        Behavior: Behavior normal.        Thought Content: Thought content normal.   Last CBC Lab Results  Component Value Date   WBC 7.1 12/28/2021   HGB 15.3 12/28/2021   HCT 45.0 12/28/2021   MCV 99.3 12/28/2021   MCH 33.9 12/28/2021   RDW 11.8 12/28/2021   PLT 196 12/28/2021   Last metabolic panel Lab Results  Component Value Date   GLUCOSE 99 04/19/2022   NA 138 04/19/2022   K 5.1 04/19/2022   CL 101 04/19/2022   CO2 21 04/19/2022   BUN 7 (L) 04/19/2022   CREATININE 0.80 04/19/2022   EGFR 100 04/19/2022   CALCIUM 9.7 04/19/2022   PROT 6.8 12/28/2021   ALBUMIN 4.1 12/28/2021   BILITOT 0.5 12/28/2021   ALKPHOS 46 12/28/2021   AST 36 12/28/2021   ALT 28 12/28/2021   ANIONGAP 14 12/28/2021     Assessment & Plan:   Problem List Items Addressed This Visit       Essential hypertension    Presenting today for HTN follow-up.  Losartan 25 mg daily was initiated at his last appointment in January.  Today he reports that he has not been taking losartan as previously prescribed.  BP remains elevated today, 152/91. -No medication changes today.  Recommend taking losartan as previously prescribed -Follow-up in 4 weeks for HTN check      BPH (benign prostatic hyperplasia) - Primary    He endorses nocturia and incomplete emptying of his bladder.  He is  interested in trialing a medication for symptom relief -Alfuzosin today -PSA added to baseline labs      COPD suggested by initial  evaluation [facility])    Previously prescribed Spiriva but states that he is only using albuterol as needed, roughly 1-2 times per week.Marland Kitchen  He is asymptomatic currently.  Inspiratory wheezes are noted diffusely on exam. -At his request, he will continue albuterol as needed for symptom relief.  He is not interested in maintenance inhaler therapy.      Return in about 4 weeks (around 06/14/2022) for HTN, lab review.   Billie Lade, MD

## 2022-05-17 NOTE — Assessment & Plan Note (Signed)
He endorses nocturia and incomplete emptying of his bladder.  He is interested in trialing a medication for symptom relief -Alfuzosin today -PSA added to baseline labs

## 2022-05-17 NOTE — Assessment & Plan Note (Signed)
Previously prescribed Spiriva but states that he is only using albuterol as needed, roughly 1-2 times per week.Colin Ellis  He is asymptomatic currently.  Inspiratory wheezes are noted diffusely on exam. -At his request, he will continue albuterol as needed for symptom relief.  He is not interested in maintenance inhaler therapy.

## 2022-05-18 LAB — CBC WITH DIFFERENTIAL/PLATELET
Basophils Absolute: 0.1 10*3/uL (ref 0.0–0.2)
Basos: 1 %
EOS (ABSOLUTE): 0.1 10*3/uL (ref 0.0–0.4)
Eos: 2 %
Hematocrit: 45.4 % (ref 37.5–51.0)
Hemoglobin: 15.2 g/dL (ref 13.0–17.7)
Immature Grans (Abs): 0.1 10*3/uL (ref 0.0–0.1)
Immature Granulocytes: 1 %
Lymphocytes Absolute: 1.1 10*3/uL (ref 0.7–3.1)
Lymphs: 17 %
MCH: 32.3 pg (ref 26.6–33.0)
MCHC: 33.5 g/dL (ref 31.5–35.7)
MCV: 97 fL (ref 79–97)
Monocytes Absolute: 0.7 10*3/uL (ref 0.1–0.9)
Monocytes: 11 %
Neutrophils Absolute: 4.3 10*3/uL (ref 1.4–7.0)
Neutrophils: 68 %
Platelets: 271 10*3/uL (ref 150–450)
RBC: 4.7 x10E6/uL (ref 4.14–5.80)
RDW: 11.9 % (ref 11.6–15.4)
WBC: 6.4 10*3/uL (ref 3.4–10.8)

## 2022-05-18 LAB — CMP14+EGFR
ALT: 19 IU/L (ref 0–44)
AST: 27 IU/L (ref 0–40)
Albumin/Globulin Ratio: 2.1 (ref 1.2–2.2)
Albumin: 4.5 g/dL (ref 3.9–4.9)
Alkaline Phosphatase: 76 IU/L (ref 44–121)
BUN/Creatinine Ratio: 7 — ABNORMAL LOW (ref 10–24)
BUN: 5 mg/dL — ABNORMAL LOW (ref 8–27)
Bilirubin Total: 0.6 mg/dL (ref 0.0–1.2)
CO2: 20 mmol/L (ref 20–29)
Calcium: 9.3 mg/dL (ref 8.6–10.2)
Chloride: 95 mmol/L — ABNORMAL LOW (ref 96–106)
Creatinine, Ser: 0.72 mg/dL — ABNORMAL LOW (ref 0.76–1.27)
Globulin, Total: 2.1 g/dL (ref 1.5–4.5)
Glucose: 90 mg/dL (ref 70–99)
Potassium: 4.9 mmol/L (ref 3.5–5.2)
Sodium: 131 mmol/L — ABNORMAL LOW (ref 134–144)
Total Protein: 6.6 g/dL (ref 6.0–8.5)
eGFR: 103 mL/min/{1.73_m2} (ref >59)

## 2022-05-18 LAB — LIPID PANEL
Chol/HDL Ratio: 1.8 ratio (ref 0.0–5.0)
Cholesterol, Total: 153 mg/dL (ref 100–199)
HDL: 83 mg/dL (ref >39)
LDL Chol Calc (NIH): 48 mg/dL (ref 0–99)
Triglycerides: 128 mg/dL (ref 0–149)
VLDL Cholesterol Cal: 22 mg/dL (ref 5–40)

## 2022-05-18 LAB — B12 AND FOLATE PANEL
Folate: 13.7 ng/mL (ref >3.0)
Vitamin B-12: 794 pg/mL (ref 232–1245)

## 2022-05-18 LAB — PSA: Prostate Specific Ag, Serum: 1.6 ng/mL (ref 0.0–4.0)

## 2022-05-18 LAB — TSH+FREE T4
Free T4: 1.31 ng/dL (ref 0.82–1.77)
TSH: 1.68 u[IU]/mL (ref 0.450–4.500)

## 2022-05-18 LAB — VITAMIN D 25 HYDROXY (VIT D DEFICIENCY, FRACTURES): Vit D, 25-Hydroxy: 39.3 ng/mL (ref 30.0–100.0)

## 2022-05-18 LAB — HEMOGLOBIN A1C
Est. average glucose Bld gHb Est-mCnc: 100 mg/dL
Hgb A1c MFr Bld: 5.1 % (ref 4.8–5.6)

## 2022-06-20 NOTE — Progress Notes (Deleted)
Office Visit Note  Patient: Colin Ellis             Date of Birth: Jun 19, 1960           MRN: [id number]             PCP: Anabel Halon, MD Referring: Katherine Basset* Visit Date: 06/28/2022 Occupation: @GUAROCC @  Subjective:  No chief complaint on file.   History of Present Illness: Colin Ellis is a 62 y.o. male ***     Activities of Daily Living:  Patient reports morning stiffness for *** {minute/hour:19697}.   Patient {ACTIONS;DENIES/REPORTS:21021675::"Denies"} nocturnal pain.  Difficulty dressing/grooming: {ACTIONS;DENIES/REPORTS:21021675::"Denies"} Difficulty climbing stairs: {ACTIONS;DENIES/REPORTS:21021675::"Denies"} Difficulty getting out of chair: {ACTIONS;DENIES/REPORTS:21021675::"Denies"} Difficulty using hands for taps, buttons, cutlery, and/or writing: {ACTIONS;DENIES/REPORTS:21021675::"Denies"}  No Rheumatology ROS completed.   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 01/31/2022   COPD suggested by initial evaluation (HCC) 01/26/2022   Encounter for general adult medical examination with abnormal findings 01/26/2022   Colon cancer screening 06/08/2021   Diarrhea 06/08/2021   S/P lumbar spinal fusion 05/28/2017    Past Medical History:  Diagnosis Date   ADH disorder    Arthritis    Electrical injury in adult    Electrician by trade, "several shocks during working career"    Psoriasis    Teeth missing due to trauma     Family History  Problem Relation Age of Onset   Colon cancer Paternal Grandfather        49s or 38s   Anesthesia problems Neg Hx    Hypotension Neg Hx    Malignant hyperthermia Neg Hx    Pseudochol deficiency Neg Hx    Past Surgical History:   Procedure Laterality Date   BACK SURGERY     herniated disc   COLONOSCOPY  11/12/2010   Surgeon: Corbin Ade, MD;  pancolonic diverticulosis, otherwise normal exam.   COLONOSCOPY WITH PROPOFOL N/A 07/09/2021   Procedure: COLONOSCOPY WITH PROPOFOL;  Surgeon: Corbin Ade, MD;  [facility];  Service: Endoscopy;  Laterality: N/A;  12:45pm   Social History   Social History Narrative   Not on file   Immunization History  Administered Date(s) Administered   Td 10/01/2011   Tdap 12/28/2021     Objective: Vital Signs: There were no vitals taken for this visit.   Physical Exam   Musculoskeletal Exam: ***  CDAI Exam: CDAI Score: -- Patient Global: --; Provider Global: -- Swollen: --; Tender: -- Joint Exam 06/28/2022   No joint exam has been documented for this visit   There is currently no information documented on the homunculus. Go to the Rheumatology activity and complete the homunculus joint exam.  Investigation: No additional findings.  Imaging: No results found.  Recent Labs: Lab Results  Component Value Date   WBC 6.4 05/17/2022   HGB 15.2 05/17/2022   PLT 271 05/17/2022   NA 131 (L) 05/17/2022   K 4.9 05/17/2022   CL 95 (L) 05/17/2022   CO2 20 05/17/2022   GLUCOSE 90 05/17/2022   BUN 5 (L) 05/17/2022   CREATININE 0.72 (L) 05/17/2022   BILITOT 0.6 05/17/2022   ALKPHOS 76 05/17/2022   AST 27 05/17/2022   ALT  19 05/17/2022   PROT 6.6 05/17/2022   ALBUMIN 4.5 05/17/2022   CALCIUM 9.3 05/17/2022   GFRAA >60 05/19/2017    Speciality Comments: No specialty comments available.  Procedures:  No procedures performed Allergies: Sulfa antibiotics   Assessment / Plan:     Visit Diagnoses: Arthritis of left hip  Psoriasis  Essential hypertension  S/P lumbar spinal fusion  History of BPH  Orders: No orders of the defined types were placed in this encounter.  No orders of the defined types were placed in this  encounter.   Face-to-face time spent with patient was *** minutes. Greater than 50% of time was spent in counseling and coordination of care.  Follow-Up Instructions: No follow-ups on file.   Gearldine Bienenstock, PA-C  Note - This record has been created using Dragon software.  Chart creation errors have been sought, but may not always  have been located. Such creation errors do not reflect on  the standard of medical care.

## 2022-06-28 ENCOUNTER — Encounter: Payer: Commercial Managed Care - PPO | Admitting: Rheumatology

## 2022-06-28 DIAGNOSIS — I10 Essential (primary) hypertension: Secondary | ICD-10-CM

## 2022-06-28 DIAGNOSIS — Z87438 Personal history of other diseases of male genital organs: Secondary | ICD-10-CM

## 2022-06-28 DIAGNOSIS — Z981 Arthrodesis status: Secondary | ICD-10-CM

## 2022-06-28 DIAGNOSIS — M1612 Unilateral primary osteoarthritis, left hip: Secondary | ICD-10-CM

## 2022-06-28 DIAGNOSIS — L409 Psoriasis, unspecified: Secondary | ICD-10-CM

## 2022-07-19 ENCOUNTER — Ambulatory Visit: Payer: Commercial Managed Care - PPO | Admitting: Rheumatology

## 2022-07-29 ENCOUNTER — Encounter: Payer: Self-pay | Admitting: Internal Medicine

## 2022-07-30 NOTE — Telephone Encounter (Signed)
Left voicemail for patient to return call to our office to schedule an appointment

## 2022-08-05 ENCOUNTER — Ambulatory Visit: Payer: Commercial Managed Care - PPO | Admitting: Internal Medicine

## 2022-08-07 ENCOUNTER — Encounter: Payer: Self-pay | Admitting: Internal Medicine

## 2022-08-08 ENCOUNTER — Other Ambulatory Visit: Payer: Self-pay

## 2022-08-08 DIAGNOSIS — I10 Essential (primary) hypertension: Secondary | ICD-10-CM

## 2022-08-08 MED ORDER — LOSARTAN POTASSIUM 25 MG PO TABS: 25.0000 mg | ORAL_TABLET | Freq: Every day | ORAL | 3 refills | Status: DC

## 2022-08-09 ENCOUNTER — Encounter: Payer: Self-pay | Admitting: Internal Medicine

## 2022-08-09 ENCOUNTER — Ambulatory Visit (INDEPENDENT_AMBULATORY_CARE_PROVIDER_SITE_OTHER): Payer: Commercial Managed Care - PPO | Admitting: Internal Medicine

## 2022-08-09 VITALS — BP 148/76 | HR 68 | Ht 72.0 in | Wt 158.2 lb

## 2022-08-09 DIAGNOSIS — R200 Anesthesia of skin: Secondary | ICD-10-CM | POA: Diagnosis not present

## 2022-08-09 DIAGNOSIS — M940 Chondrocostal junction syndrome [Tietze]: Secondary | ICD-10-CM | POA: Insufficient documentation

## 2022-08-09 DIAGNOSIS — R202 Paresthesia of skin: Secondary | ICD-10-CM

## 2022-08-09 DIAGNOSIS — N400 Enlarged prostate without lower urinary tract symptoms: Secondary | ICD-10-CM | POA: Diagnosis not present

## 2022-08-09 DIAGNOSIS — I10 Essential (primary) hypertension: Secondary | ICD-10-CM | POA: Diagnosis not present

## 2022-08-09 DIAGNOSIS — N401 Enlarged prostate with lower urinary tract symptoms: Secondary | ICD-10-CM

## 2022-08-09 DIAGNOSIS — M5030 Other cervical disc degeneration, unspecified cervical region: Secondary | ICD-10-CM | POA: Insufficient documentation

## 2022-08-09 DIAGNOSIS — J449 Chronic obstructive pulmonary disease, unspecified: Secondary | ICD-10-CM | POA: Diagnosis not present

## 2022-08-09 DIAGNOSIS — R350 Frequency of micturition: Secondary | ICD-10-CM

## 2022-08-09 MED ORDER — GABAPENTIN 100 MG PO CAPS: 100.0000 mg | ORAL_CAPSULE | Freq: Every day | ORAL | 1 refills | Status: DC

## 2022-08-09 MED ORDER — MELOXICAM 7.5 MG PO TABS: 7.5000 mg | ORAL_TABLET | Freq: Every day | ORAL | 2 refills | Status: DC

## 2022-08-09 MED ORDER — ALFUZOSIN HCL ER 10 MG PO TB24: 10.0000 mg | ORAL_TABLET | Freq: Every day | ORAL | 11 refills | Status: DC

## 2022-08-09 NOTE — Assessment & Plan Note (Signed)
Well-controlled currently Uses Albuterol PRN Used to have Spiriva

## 2022-08-09 NOTE — Assessment & Plan Note (Signed)
BP Readings from Last 1 Encounters:  08/09/22 (!) 148/76   Elevated today as he has run out of Losartan Usually well-controlled with Losartan 25 mg QD Counseled for compliance with the medications Advised DASH diet and moderate exercise/walking, at least 150 mins/week

## 2022-08-09 NOTE — Progress Notes (Addendum)
Established Patient Office Visit  Subjective:  Patient ID: Colin Ellis, male    DOB: 1960/03/12  Age: 62 y.o. MRN: [id number]  CC:  Chief Complaint  Patient presents with   Chest Pain    Patient is having pain and tingling sensation in chest, neck and shoulders    HPI Colin Ellis is a 62 y.o. male with past medical history of HTN, COPD, BPH and DDD of lumbar spine who presents for f/u of his chronic medical conditions.  HTN: His BP is elevated today. He has run out of Losartan. He denies any headache, dyspnea or palpitation.  BPH: He is taking Uroxatral for nocturia. Denies any dysuria, hematuria or urinary incontinence.  He reports upper left sided chest wall pain, which is tingling, worse with movement and is intermittent. He also reports tingling in the neck and upper left arm. He has had Cardiology referral for it in the past, which was benign. He has h/o lumbar DDD, and has had lumbar fusion surgery.      Past Medical History:  Diagnosis Date   ADH disorder    Arthritis    Electrical injury in adult    Electrician by trade, "several shocks during working career"    Psoriasis    Teeth missing due to trauma     Past Surgical History:  Procedure Laterality Date   BACK SURGERY     herniated disc   COLONOSCOPY  11/12/2010   Surgeon: Corbin Ade, MD;  pancolonic diverticulosis, otherwise normal exam.   COLONOSCOPY WITH PROPOFOL N/A 07/09/2021   Procedure: COLONOSCOPY WITH PROPOFOL;  Surgeon: Corbin Ade, MD;  [facility];  Service: Endoscopy;  Laterality: N/A;  12:45pm    Family History  Problem Relation Age of Onset   Colon cancer Paternal Grandfather        5s or 36s   Anesthesia problems Neg Hx    Hypotension Neg Hx    Malignant hyperthermia Neg Hx    Pseudochol deficiency Neg Hx     Social History   Socioeconomic History   Marital status: Married    Spouse name: Not on file   Number of children: Not on file   Years of  education: Not on file   Highest education level: 12th grade  Occupational History   Not on file  Tobacco Use   Smoking status: Every Day    Current packs/day: 1.00    Average packs/day: 1 pack/day for 40.0 years (40.0 ttl pk-yrs)    Types: Cigarettes   Smokeless tobacco: Never  Vaping Use   Vaping status: Never Used  Substance and Sexual Activity   Alcohol use: Yes    Alcohol/week: 4.0 standard drinks of alcohol    Types: 4 Cans of beer per week    Comment: 3-4 beers a night   Drug use: No   Sexual activity: Not on file  Other Topics Concern   Not on file  Social History Narrative   Not on file   Social Determinants of Health   Financial Resource Strain: Low Risk  (05/13/2022)   Overall Financial Resource Strain (CARDIA)    Difficulty of Paying Living Expenses: Not hard at all  Food Insecurity: No Food Insecurity (05/13/2022)   Hunger Vital Sign    Worried About Running Out of Food in the Last Year: Never true    Ran Out of Food in the Last Year: Never true  Transportation Needs: Unmet Transportation Needs (05/13/2022)  PRAPARE - Administrator, Civil Service (Medical): Yes    Lack of Transportation (Non-Medical): No  Physical Activity: Sufficiently Active (05/13/2022)   Exercise Vital Sign    Days of Exercise per Week: 6 days    Minutes of Exercise per Session: 150+ min  Stress: No Stress Concern Present (05/13/2022)   [employer] of Occupational Health - Occupational Stress Questionnaire    Feeling of Stress : Only a little  Social Connections: Moderately Isolated (05/13/2022)   Social Connection and Isolation Panel [NHANES]    Frequency of Communication with Friends and Family: More than three times a week    Frequency of Social Gatherings with Friends and Family: Patient declined    Attends Religious Services: Never    Database administrator or Organizations: No    Attends Engineer, structural: Not on file    Marital Status: Married   Catering manager Violence: Not on file    Outpatient Medications Prior to Visit  Medication Sig Dispense Refill   albuterol (VENTOLIN HFA) 108 (90 Base) MCG/ACT inhaler Inhale 2 puffs into the lungs every 6 (six) hours as needed for wheezing or shortness of breath. 8 g 2   losartan (COZAAR) 25 MG tablet Take 1 tablet (25 mg total) by mouth daily. 30 tablet 3   alfuzosin (UROXATRAL) 10 MG 24 hr tablet Take 1 tablet (10 mg total) by mouth daily with breakfast. 30 tablet 2   No facility-administered medications prior to visit.    Allergies  Allergen Reactions   Sulfa Antibiotics Nausea And Vomiting    Upset stomach    ROS Review of Systems  Constitutional:  Positive for fatigue. Negative for chills and fever.  HENT:  Negative for congestion, sinus pressure and sinus pain.   Eyes:  Negative for pain and discharge.  Respiratory:  Negative for cough and shortness of breath.   Cardiovascular:  Negative for chest pain and palpitations.  Gastrointestinal:  Negative for diarrhea, nausea and vomiting.  Genitourinary:  Negative for dysuria and hematuria.  Musculoskeletal:  Positive for arthralgias, back pain and gait problem.  Skin:  Negative for rash.  Neurological:  Positive for dizziness and weakness.  Psychiatric/Behavioral:  Positive for decreased concentration. Negative for agitation and confusion. The patient is nervous/anxious.       Objective:    Physical Exam Constitutional:      General: He is not in acute distress.    Appearance: He is not diaphoretic.  HENT:     Head: Normocephalic.     Nose: No congestion.     Mouth/Throat:     Mouth: Mucous membranes are moist.     Pharynx: No posterior oropharyngeal erythema.  Eyes:     General: No scleral icterus.    Extraocular Movements: Extraocular movements intact.  Cardiovascular:     Rate and Rhythm: Normal rate and regular rhythm.     Heart sounds: Normal heart sounds. No murmur heard. Pulmonary:     Breath sounds:  Normal breath sounds. No wheezing or rales.  Chest:     Chest wall: Tenderness (Mild, left upper chest wall area) present.  Musculoskeletal:     Right lower leg: No edema.     Left lower leg: No edema.  Skin:    General: Skin is warm.     Findings: No rash.  Neurological:     General: No focal deficit present.     Mental Status: He is alert and oriented to person, place, and  time.     Cranial Nerves: No cranial nerve deficit.     Sensory: No sensory deficit.     Motor: Weakness (LLE - 4/5) present.     BP (!) 148/76 (BP Location: Left Arm)   Pulse 68   Ht 6' (1.829 m)   Wt 158 lb 3.2 oz (71.8 kg)   SpO2 98%   BMI 21.46 kg/m  Wt Readings from Last 3 Encounters:  08/09/22 158 lb 3.2 oz (71.8 kg)  05/17/22 160 lb 3.2 oz (72.7 kg)  02/19/22 154 lb 12.8 oz (70.2 kg)    Lab Results  Component Value Date   TSH 1.680 05/17/2022   Lab Results  Component Value Date   WBC 6.4 05/17/2022   HGB 15.2 05/17/2022   HCT 45.4 05/17/2022   MCV 97 05/17/2022   PLT 271 05/17/2022   Lab Results  Component Value Date   NA 131 (L) 05/17/2022   K 4.9 05/17/2022   CO2 20 05/17/2022   GLUCOSE 90 05/17/2022   BUN 5 (L) 05/17/2022   CREATININE 0.72 (L) 05/17/2022   BILITOT 0.6 05/17/2022   ALKPHOS 76 05/17/2022   AST 27 05/17/2022   ALT 19 05/17/2022   PROT 6.6 05/17/2022   ALBUMIN 4.5 05/17/2022   CALCIUM 9.3 05/17/2022   ANIONGAP 14 12/28/2021   EGFR 103 05/17/2022   Lab Results  Component Value Date   CHOL 153 05/17/2022   Lab Results  Component Value Date   HDL 83 05/17/2022   Lab Results  Component Value Date   LDLCALC 48 05/17/2022   Lab Results  Component Value Date   TRIG 128 05/17/2022   Lab Results  Component Value Date   CHOLHDL 1.8 05/17/2022   Lab Results  Component Value Date   HGBA1C 5.1 05/17/2022      Assessment & Plan:   Problem List Items Addressed This Visit       Cardiovascular and Mediastinum   Essential hypertension - Primary     BP Readings from Last 1 Encounters:  08/09/22 (!) 148/76   Elevated today as he has run out of Losartan Usually well-controlled with Losartan 25 mg QD Counseled for compliance with the medications Advised DASH diet and moderate exercise/walking, at least 150 mins/week         Respiratory   COPD (chronic obstructive pulmonary disease) (HCC)    Well-controlled currently Uses Albuterol PRN Used to have Spiriva        Musculoskeletal and Integument   Costochondritis    Her chest wall pain is likely costochondritis Started Meloxicam 7.5 mg QD PRN Needs to avoid multiple daily dosing of Aleve      Relevant Medications   meloxicam (MOBIC) 7.5 MG tablet     Genitourinary   BPH (benign prostatic hyperplasia)    Improved with Uroxatral      Relevant Medications   alfuzosin (UROXATRAL) 10 MG 24 hr tablet     Other   Numbness and tingling    Tingling around neck and upper left arm could be due to cervical spondylosis Has h/o alcohol abuse - peripheral neuropathy can be a factor as well Advised to take Vitamin B12 500 mcg QD Trial of Gabapentin 100 mg at bedtime If persistent, will need imaging      Relevant Medications   gabapentin (NEURONTIN) 100 MG capsule    Meds ordered this encounter  Medications   gabapentin (NEURONTIN) 100 MG capsule    Sig: Take 1 capsule (100 mg  total) by mouth at bedtime.    Dispense:  90 capsule    Refill:  1   meloxicam (MOBIC) 7.5 MG tablet    Sig: Take 1 tablet (7.5 mg total) by mouth daily.    Dispense:  30 tablet    Refill:  2   alfuzosin (UROXATRAL) 10 MG 24 hr tablet    Sig: Take 1 tablet (10 mg total) by mouth daily with breakfast.    Dispense:  30 tablet    Refill:  11    Follow-up: Return in about 3 months (around 11/09/2022) for HTN.    Anabel Halon, MD

## 2022-08-09 NOTE — Patient Instructions (Signed)
Please start taking Gabapentin 100 mg at bedtime for tingling of the neck and arm. Please avoid alcohol consumption with Gabapentin.  Please take Meloxicam for chest wall and neck pain.  Please take Vitamin B12 500 mcg once daily.  Please continue to take medications as prescribed.  Please continue to follow low salt diet and perform moderate exercise/walking at least 150 mins/week.

## 2022-08-09 NOTE — Assessment & Plan Note (Addendum)
Tingling around neck and upper left arm could be due to cervical spondylosis Has h/o alcohol abuse - peripheral neuropathy can be a factor as well Advised to take Vitamin B12 500 mcg QD Trial of Gabapentin 100 mg at bedtime If persistent, will need imaging

## 2022-08-09 NOTE — Assessment & Plan Note (Signed)
Improved with Uroxatral

## 2022-08-09 NOTE — Assessment & Plan Note (Signed)
Her chest wall pain is likely costochondritis Started Meloxicam 7.5 mg QD PRN Needs to avoid multiple daily dosing of Aleve

## 2022-11-08 ENCOUNTER — Encounter: Payer: Self-pay | Admitting: Internal Medicine

## 2022-11-08 ENCOUNTER — Ambulatory Visit (INDEPENDENT_AMBULATORY_CARE_PROVIDER_SITE_OTHER): Payer: Commercial Managed Care - PPO | Admitting: Internal Medicine

## 2022-11-08 VITALS — BP 158/78 | HR 66 | Ht 72.0 in | Wt 158.0 lb

## 2022-11-08 DIAGNOSIS — Z720 Tobacco use: Secondary | ICD-10-CM

## 2022-11-08 DIAGNOSIS — J449 Chronic obstructive pulmonary disease, unspecified: Secondary | ICD-10-CM

## 2022-11-08 DIAGNOSIS — I10 Essential (primary) hypertension: Secondary | ICD-10-CM | POA: Diagnosis not present

## 2022-11-08 DIAGNOSIS — F1029 Alcohol dependence with unspecified alcohol-induced disorder: Secondary | ICD-10-CM

## 2022-11-08 DIAGNOSIS — N400 Enlarged prostate without lower urinary tract symptoms: Secondary | ICD-10-CM

## 2022-11-08 MED ORDER — LOSARTAN POTASSIUM 50 MG PO TABS: 50.0000 mg | ORAL_TABLET | Freq: Every day | ORAL | 3 refills | Status: DC

## 2022-11-08 NOTE — Assessment & Plan Note (Signed)
Smokes about 0.75 pack/day  Asked about quitting: confirms that he/she currently smokes cigarettes Advise to quit smoking: Educated about QUITTING to reduce the risk of cancer, cardio and cerebrovascular disease. Assess willingness: Unwilling to quit at this time, but is working on cutting back. Assist with counseling and pharmacotherapy: Counseled for 5 minutes and literature provided. Arrange for follow up: follow up in 3 months and continue to offer help.

## 2022-11-08 NOTE — Assessment & Plan Note (Addendum)
BP Readings from Last 1 Encounters:  11/08/22 (!) 158/78   Uncontrolled with Losartan 25 mg once daily Increased dose of Losartan to 50 mg once daily Needs to cut down on alcohol use and quit smoking Counseled for compliance with the medications Advised DASH diet and moderate exercise/walking, at least 150 mins/week

## 2022-11-08 NOTE — Assessment & Plan Note (Signed)
Takes at least 2 beers every day, likely a factor for his elevated BP Advised to cut down to 1 drink per day and avoid binge drinking

## 2022-11-08 NOTE — Assessment & Plan Note (Signed)
Improved with Uroxatral
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
## 2022-11-08 NOTE — Patient Instructions (Signed)
Please start taking Losartan 50 mg once daily instead of 25 mg.  Please continue to take medications as prescribed.  Please continue to follow low salt diet and perform moderate exercise/walking at least 150 mins/week.

## 2022-11-08 NOTE — Assessment & Plan Note (Addendum)
Well-controlled currently Uses Albuterol PRN, uses it rarely Used to have Spiriva Needs to quit smoking

## 2022-11-09 LAB — CMP14+EGFR
ALT: 15 [IU]/L (ref 0–44)
AST: 22 [IU]/L (ref 0–40)
Albumin: 4.5 g/dL (ref 3.9–4.9)
Alkaline Phosphatase: 66 [IU]/L (ref 44–121)
BUN/Creatinine Ratio: 7 — ABNORMAL LOW (ref 10–24)
BUN: 5 mg/dL — ABNORMAL LOW (ref 8–27)
Bilirubin Total: 0.6 mg/dL (ref 0.0–1.2)
CO2: 23 mmol/L (ref 20–29)
Calcium: 9.4 mg/dL (ref 8.6–10.2)
Chloride: 97 mmol/L (ref 96–106)
Creatinine, Ser: 0.73 mg/dL — ABNORMAL LOW (ref 0.76–1.27)
Globulin, Total: 2.3 g/dL (ref 1.5–4.5)
Glucose: 97 mg/dL (ref 70–99)
Potassium: 4.9 mmol/L (ref 3.5–5.2)
Sodium: 135 mmol/L (ref 134–144)
Total Protein: 6.8 g/dL (ref 6.0–8.5)
eGFR: 103 mL/min/{1.73_m2} (ref >59)

## 2022-11-10 ENCOUNTER — Other Ambulatory Visit: Payer: Self-pay | Admitting: Internal Medicine

## 2022-11-10 DIAGNOSIS — M940 Chondrocostal junction syndrome [Tietze]: Secondary | ICD-10-CM

## 2022-11-12 ENCOUNTER — Encounter: Payer: Self-pay | Admitting: Internal Medicine

## 2022-11-29 ENCOUNTER — Encounter: Payer: Self-pay | Admitting: Internal Medicine

## 2022-12-02 ENCOUNTER — Other Ambulatory Visit: Payer: Self-pay | Admitting: Internal Medicine

## 2022-12-02 DIAGNOSIS — L239 Allergic contact dermatitis, unspecified cause: Secondary | ICD-10-CM | POA: Insufficient documentation

## 2022-12-02 DIAGNOSIS — L409 Psoriasis, unspecified: Secondary | ICD-10-CM | POA: Insufficient documentation

## 2022-12-02 MED ORDER — CLOBETASOL PROPIONATE 0.05 % EX OINT: 1.0000 | TOPICAL_OINTMENT | Freq: Two times a day (BID) | CUTANEOUS | 0 refills | Status: DC

## 2022-12-30 ENCOUNTER — Encounter: Payer: Self-pay | Admitting: Internal Medicine

## 2022-12-31 ENCOUNTER — Ambulatory Visit: Payer: Commercial Managed Care - PPO | Admitting: Student

## 2022-12-31 NOTE — Telephone Encounter (Signed)
Scheduled for Friday 12/27

## 2023-01-02 ENCOUNTER — Encounter: Payer: Self-pay | Admitting: Internal Medicine

## 2023-01-17 ENCOUNTER — Ambulatory Visit: Payer: Self-pay | Admitting: Family Medicine

## 2023-01-20 ENCOUNTER — Other Ambulatory Visit: Payer: Self-pay | Admitting: Internal Medicine

## 2023-01-20 DIAGNOSIS — R202 Paresthesia of skin: Secondary | ICD-10-CM

## 2023-02-21 ENCOUNTER — Encounter: Payer: Self-pay | Admitting: Internal Medicine

## 2023-02-21 ENCOUNTER — Ambulatory Visit (INDEPENDENT_AMBULATORY_CARE_PROVIDER_SITE_OTHER): Payer: Commercial Managed Care - PPO | Admitting: Internal Medicine

## 2023-02-21 VITALS — BP 138/82 | HR 67 | Ht 72.0 in | Wt 160.8 lb

## 2023-02-21 DIAGNOSIS — L409 Psoriasis, unspecified: Secondary | ICD-10-CM

## 2023-02-21 DIAGNOSIS — I10 Essential (primary) hypertension: Secondary | ICD-10-CM

## 2023-02-21 DIAGNOSIS — N400 Enlarged prostate without lower urinary tract symptoms: Secondary | ICD-10-CM | POA: Diagnosis not present

## 2023-02-21 DIAGNOSIS — M898X1 Other specified disorders of bone, shoulder: Secondary | ICD-10-CM | POA: Insufficient documentation

## 2023-02-21 DIAGNOSIS — G8929 Other chronic pain: Secondary | ICD-10-CM | POA: Insufficient documentation

## 2023-02-21 DIAGNOSIS — F1029 Alcohol dependence with unspecified alcohol-induced disorder: Secondary | ICD-10-CM

## 2023-02-21 DIAGNOSIS — M25512 Pain in left shoulder: Secondary | ICD-10-CM

## 2023-02-21 DIAGNOSIS — J449 Chronic obstructive pulmonary disease, unspecified: Secondary | ICD-10-CM

## 2023-02-21 DIAGNOSIS — Z0001 Encounter for general adult medical examination with abnormal findings: Secondary | ICD-10-CM

## 2023-02-21 MED ORDER — MELOXICAM 7.5 MG PO TABS: 7.5000 mg | ORAL_TABLET | Freq: Every day | ORAL | 5 refills | Status: DC

## 2023-02-21 MED ORDER — CLOBETASOL PROPIONATE 0.05 % EX OINT: 1.0000 | TOPICAL_OINTMENT | Freq: Two times a day (BID) | CUTANEOUS | 1 refills | Status: AC

## 2023-02-21 NOTE — Assessment & Plan Note (Signed)
Chronic  psoriatic rash over b/l shin area Refilled clobetasol ointment He prefers to avoid any other treatment for now

## 2023-02-21 NOTE — Assessment & Plan Note (Signed)
Takes at least 2 beers every day, likely a factor for his elevated BP Advised to cut down to 1 drink per day and avoid binge drinking
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
## 2023-02-21 NOTE — Assessment & Plan Note (Signed)
Improved with Uroxatral initially, but he stopped taking it later Needs to cut down alcohol use, leads to urinary frequency

## 2023-02-21 NOTE — Assessment & Plan Note (Signed)
Chronic, since 12/28/21 Check x-ray of left clavicle Has meloxicam as needed for pain

## 2023-02-21 NOTE — Assessment & Plan Note (Signed)
BP Readings from Last 1 Encounters:  02/21/23 138/82   Well-controlled with Losartan 50 mg once daily Needs to cut down on alcohol use and quit smoking Counseled for compliance with the medications Advised DASH diet and moderate exercise/walking, at least 150 mins/week

## 2023-02-21 NOTE — Patient Instructions (Signed)
Please start applying Clobetasol cream over legs.  Please get X-rays of clavicle and shoulder done as discussed.  Please continue to take medications as prescribed.  Please continue to follow DASH diet and perform moderate exercise/walking at least 150 mins/week.

## 2023-02-21 NOTE — Assessment & Plan Note (Signed)
Physical exam as documented. Fasting blood tests today. Advised to get Shingrix and PCV20 vaccines, but he prefers to wait for now.

## 2023-02-21 NOTE — Assessment & Plan Note (Signed)
Chronic, since 12/28/21 Check x-ray of left shoulder ROM intact at left shoulder - if persistent pain, will refer to orthopedic surgery Has meloxicam as needed for pain

## 2023-02-21 NOTE — Assessment & Plan Note (Signed)
Well-controlled currently Uses Albuterol PRN, uses it rarely Used to have Spiriva Needs to quit smoking

## 2023-02-28 ENCOUNTER — Ambulatory Visit (HOSPITAL_COMMUNITY)
Admission: RE | Admit: 2023-02-28 | Discharge: 2023-02-28 | Disposition: A | Discharge status: Home / Self Care | Payer: Commercial Managed Care - PPO | Source: Ambulatory Visit | Attending: Internal Medicine | Admitting: Internal Medicine

## 2023-02-28 DIAGNOSIS — M898X1 Other specified disorders of bone, shoulder: Secondary | ICD-10-CM | POA: Insufficient documentation

## 2023-02-28 DIAGNOSIS — M25512 Pain in left shoulder: Secondary | ICD-10-CM | POA: Diagnosis present

## 2023-02-28 DIAGNOSIS — G8929 Other chronic pain: Secondary | ICD-10-CM | POA: Insufficient documentation

## 2023-03-01 ENCOUNTER — Encounter: Payer: Self-pay | Admitting: Internal Medicine

## 2023-03-07 ENCOUNTER — Encounter: Payer: Self-pay | Admitting: Internal Medicine

## 2023-03-14 ENCOUNTER — Encounter: Payer: Self-pay | Admitting: Internal Medicine

## 2023-03-14 ENCOUNTER — Ambulatory Visit (INDEPENDENT_AMBULATORY_CARE_PROVIDER_SITE_OTHER): Payer: Commercial Managed Care - PPO | Admitting: Internal Medicine

## 2023-03-14 VITALS — BP 136/79 | HR 81 | Ht 72.0 in | Wt 160.8 lb

## 2023-03-14 DIAGNOSIS — M19012 Primary osteoarthritis, left shoulder: Secondary | ICD-10-CM | POA: Insufficient documentation

## 2023-03-14 DIAGNOSIS — F1029 Alcohol dependence with unspecified alcohol-induced disorder: Secondary | ICD-10-CM

## 2023-03-14 DIAGNOSIS — F331 Major depressive disorder, recurrent, moderate: Secondary | ICD-10-CM | POA: Diagnosis not present

## 2023-03-14 DIAGNOSIS — M898X1 Other specified disorders of bone, shoulder: Secondary | ICD-10-CM

## 2023-03-14 MED ORDER — MIRTAZAPINE 7.5 MG PO TABS: 7.5000 mg | ORAL_TABLET | Freq: Every day | ORAL | 3 refills | Status: DC

## 2023-03-14 MED ORDER — MELOXICAM 15 MG PO TABS: 15.0000 mg | ORAL_TABLET | Freq: Every day | ORAL | 3 refills | Status: DC

## 2023-03-14 NOTE — Assessment & Plan Note (Signed)
Uncontrolled Has lack of appetite and insomnia - started Remeron 7.5 mg qHS Advised to engage in activities of liking and perform relaxation exercises
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
## 2023-03-14 NOTE — Assessment & Plan Note (Signed)
Takes at least 2 beers every day, likely a factor for his elevated BP and MDD Advised to cut down to 1 drink per day and avoid binge drinking

## 2023-03-14 NOTE — Assessment & Plan Note (Signed)
Chronic pain, since 12/28/21 Checked x-ray of left shoulder and clavicle ROM intact at left shoulder - Due to persistent pain, will refer to orthopedic surgery Has meloxicam as needed for pain, increased dose to 15 mg QD

## 2023-03-14 NOTE — Patient Instructions (Addendum)
Please start taking Meloxicam 15 mg as prescribed.  Please start taking Remeron as prescribed.  Please maintain simple sleep hygiene. - Maintain dark and non-noisy environment in the bedroom. - Please use the bedroom for sleep and sexual activity only. - Do not use electronic devices in the bedroom. - Please take dinner at least 2 hours before bedtime. - Please avoid caffeinated products in the evening, including coffee, soft drinks. - Please try to maintain the regular sleep-wake cycle - Go to bed and wake up at the same time.

## 2023-03-14 NOTE — Assessment & Plan Note (Signed)
Chronic, since 12/28/21 Checked x-ray of left clavicle - no acute fracture noted Has meloxicam as needed for pain

## 2023-03-21 ENCOUNTER — Encounter: Payer: Self-pay | Admitting: Internal Medicine

## 2023-03-21 ENCOUNTER — Encounter: Payer: Self-pay | Admitting: Orthopedic Surgery

## 2023-03-21 ENCOUNTER — Ambulatory Visit: Payer: Commercial Managed Care - PPO | Admitting: Orthopedic Surgery

## 2023-03-21 VITALS — BP 175/113 | HR 80 | Ht 72.0 in | Wt 160.0 lb

## 2023-03-21 DIAGNOSIS — M898X1 Other specified disorders of bone, shoulder: Secondary | ICD-10-CM | POA: Diagnosis not present

## 2023-03-21 NOTE — Progress Notes (Signed)
  Intake history:  Ht 6' (1.829 m)   Wt 160 lb (72.6 kg)   BMI 21.70 kg/m  Body mass index is 21.7 kg/m.    WHAT ARE WE SEEING YOU FOR TODAY?   left shoulder  How long has this bothered you? (DOI?DOS?WS?)  on long duration at least 5 years   Anticoag.  No  Diabetes no  Heart disease   Hypertension Yes  SMOKING HX Yes  Kidney disease No  Any ALLERGIES __________ Allergies  Allergen Reactions   Sulfa Antibiotics Nausea And Vomiting    Upset stomach   ____________________________________   Treatment:  Have you taken:  Tylenol No  Advil No  Had PT No  Had injection No  Other  ________________on Meloxicam _________   Works as an Personnel officer

## 2023-03-21 NOTE — Progress Notes (Signed)
  Subjective:     Patient ID: Colin Ellis, male   DOB: 09-13-1960, 63 y.o.   MRN: [id number]  63 yo male presents w/ pain over his left clavicle.  He says it feels like there is pressure under the clavicle.  He says it is uncomfortable but not painful  He denies any neck pain, numbness or tingling.  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  Upon review of that film the arthritis is very minuscule  Shoulder Pain      Review of Systems  Respiratory:  Negative for chest tightness and shortness of breath.   Cardiovascular:  Negative for chest pain.       Objective:   Physical Exam Vitals and nursing note reviewed.  Constitutional:      Appearance: Normal appearance.  HENT:     Head: Normocephalic and atraumatic.  Eyes:     General: No scleral icterus.       Right eye: No discharge.        Left eye: No discharge.     Extraocular Movements: Extraocular movements intact.     Conjunctiva/sclera: Conjunctivae normal.     Pupils: Pupils are equal, round, and reactive to light.  Cardiovascular:     Rate and Rhythm: Normal rate.     Pulses: Normal pulses.  Musculoskeletal:     Comments: Left shoulder Skin normal Tenderness none Range of motion normal Strength normal Test normal Provocative  tests for impingement negative  Neck exam Skin normal No tenderness Rotation was mildly compromise right to left but no reproduction of symptoms with flexion extension rotation or lateral bend  Skin:    General: Skin is warm and dry.     Capillary Refill: Capillary refill takes less than 2 seconds.  Neurological:     General: No focal deficit present.     Mental Status: He is alert and oriented to person, place, and time.  Psychiatric:        Mood and Affect: Mood normal.        Behavior: Behavior normal.        Thought Content: Thought  content normal.        Judgment: Judgment normal.        Assessment:     Unclear etiology of midclavicular superior chest wall pain  Rule out chest mass or subclavian artery vein abnormality    Plan:     CT scan chest

## 2023-03-21 NOTE — Patient Instructions (Addendum)
While we are working on your approval forCT please go ahead and call to schedule your appointment with [facility] within at least one (1) week.   Central Scheduling [phone]  

## 2023-04-03 ENCOUNTER — Other Ambulatory Visit: Payer: Self-pay | Admitting: Internal Medicine

## 2023-04-03 DIAGNOSIS — I10 Essential (primary) hypertension: Secondary | ICD-10-CM

## 2023-04-10 NOTE — Addendum Note (Signed)
 Addended byCaffie Damme on: 04/10/2023 08:06 AM   Modules accepted: Orders

## 2023-04-11 ENCOUNTER — Ambulatory Visit (HOSPITAL_COMMUNITY)
Admission: RE | Admit: 2023-04-11 | Discharge: 2023-04-11 | Disposition: A | Discharge status: Home / Self Care | Payer: Commercial Managed Care - PPO | Source: Ambulatory Visit | Attending: Orthopedic Surgery | Admitting: Orthopedic Surgery

## 2023-04-11 DIAGNOSIS — M898X1 Other specified disorders of bone, shoulder: Secondary | ICD-10-CM | POA: Insufficient documentation

## 2023-04-11 MED ORDER — IOHEXOL 300 MG/ML  SOLN
75.0000 mL | Freq: Once | INTRAMUSCULAR | Status: AC | PRN
  Administered 2023-04-11: 75 mL via INTRAVENOUS

## 2023-05-01 NOTE — Progress Notes (Unsigned)
   There were no vitals taken for this visit.  There is no height or weight on file to calculate BMI.  No chief complaint on file.   No diagnosis found.  DOI/DOS/ Date: ongoing clavicle/ shoulder pain/ here to review the CT scan  of chest.   {CHL AMB ORT SYMPTOMS POST TREATMENT:21798}

## 2023-05-02 ENCOUNTER — Ambulatory Visit (INDEPENDENT_AMBULATORY_CARE_PROVIDER_SITE_OTHER): Admitting: Orthopedic Surgery

## 2023-05-02 DIAGNOSIS — M898X1 Other specified disorders of bone, shoulder: Secondary | ICD-10-CM

## 2023-05-02 DIAGNOSIS — M47812 Spondylosis without myelopathy or radiculopathy, cervical region: Secondary | ICD-10-CM | POA: Diagnosis not present

## 2023-05-02 NOTE — Progress Notes (Signed)
   Follow-up CT scan results   Chief Complaint  Patient presents with   Shoulder Pain    left    Encounter Diagnoses  Name Primary?   Clavicle pain Yes   Cervical spondylosis     DOI/DOS/ Date: ongoing clavicle/ shoulder pain/ here to review the CT scan  of chest.   Unchanged  CT scan review I reviewed the CT scan I do not see any mass or clavicular abnormalities.  We do note some degenerative disc changes in the lower cervical spine I suspect this is where his symptoms are coming from.  I reviewed the images with the patient he is agreeable to nonoperative treatment no further treatment is needed

## 2023-05-26 ENCOUNTER — Encounter: Payer: Self-pay | Admitting: Internal Medicine

## 2023-05-30 ENCOUNTER — Encounter: Payer: Self-pay | Admitting: Internal Medicine

## 2023-05-30 ENCOUNTER — Ambulatory Visit (INDEPENDENT_AMBULATORY_CARE_PROVIDER_SITE_OTHER): Admitting: Internal Medicine

## 2023-05-30 VITALS — BP 146/80 | HR 69 | Resp 15 | Ht 72.0 in | Wt 156.0 lb

## 2023-05-30 DIAGNOSIS — M5030 Other cervical disc degeneration, unspecified cervical region: Secondary | ICD-10-CM

## 2023-05-30 DIAGNOSIS — Z23 Encounter for immunization: Secondary | ICD-10-CM | POA: Diagnosis not present

## 2023-05-30 DIAGNOSIS — I10 Essential (primary) hypertension: Secondary | ICD-10-CM

## 2023-05-30 DIAGNOSIS — F1029 Alcohol dependence with unspecified alcohol-induced disorder: Secondary | ICD-10-CM | POA: Diagnosis not present

## 2023-05-30 DIAGNOSIS — R739 Hyperglycemia, unspecified: Secondary | ICD-10-CM

## 2023-05-30 DIAGNOSIS — J449 Chronic obstructive pulmonary disease, unspecified: Secondary | ICD-10-CM

## 2023-05-30 DIAGNOSIS — R4189 Other symptoms and signs involving cognitive functions and awareness: Secondary | ICD-10-CM | POA: Diagnosis not present

## 2023-05-30 DIAGNOSIS — E559 Vitamin D deficiency, unspecified: Secondary | ICD-10-CM

## 2023-05-30 DIAGNOSIS — E785 Hyperlipidemia, unspecified: Secondary | ICD-10-CM

## 2023-05-30 MED ORDER — LOSARTAN POTASSIUM 100 MG PO TABS: 100.0000 mg | ORAL_TABLET | Freq: Every day | ORAL | 1 refills | Status: DC

## 2023-05-30 MED ORDER — GABAPENTIN 100 MG PO CAPS: 100.0000 mg | ORAL_CAPSULE | Freq: Every day | ORAL | 1 refills | Status: DC

## 2023-05-30 NOTE — Assessment & Plan Note (Signed)
 MoCA: 24/30 Likely has impact of daily alcohol intake, has been repeatedly advised to avoid alcohol on daily basis and binge drinking Needs to improve sleep hygiene Maintain adequate hydration and eat at regular intervals Advised to take vitamin B1 and B12 supplements
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
## 2023-05-30 NOTE — Patient Instructions (Addendum)
 Please start taking Losartan  100 mg once daily instead of 50 mg.  Please start taking Gabapentin  for numbness and tingling.  Please start taking Vitamin B1 - 100 mg and B12 - 500 mcg once daily.  Please continue to follow low salt diet and perform moderate exercise/walking at least 150 mins/week.

## 2023-05-30 NOTE — Assessment & Plan Note (Addendum)
 Takes at least 2 beers every day, likely a factor for his elevated BP and MDD Advised to cut down to 1 drink at a time, and avoid daily drinking and avoid binge drinking

## 2023-05-30 NOTE — Assessment & Plan Note (Addendum)
 BP Readings from Last 1 Encounters:  05/30/23 (!) 146/80   Uncontrolled with losartan  50 mg QD Increased dose of losartan  to 100 mg QD Needs to cut down on alcohol use and quit smoking Counseled for compliance with the medications Advised DASH diet and moderate exercise/walking, at least 150 mins/week

## 2023-05-30 NOTE — Assessment & Plan Note (Signed)
Well-controlled currently Uses Albuterol PRN, uses it rarely Used to have Spiriva Needs to quit smoking

## 2023-05-30 NOTE — Assessment & Plan Note (Signed)
 Tingling around neck and upper left arm could be due to cervical spondylosis Has h/o alcohol abuse - peripheral neuropathy can be a factor as well Advised to take Vitamin B12 500 mcg QD Advised to continue gabapentin  100 mg at bedtime

## 2023-06-06 ENCOUNTER — Emergency Department (HOSPITAL_COMMUNITY)
Admission: EM | Admit: 2023-06-06 | Discharge: 2023-06-06 | Discharge status: Against Medical Advice | Attending: Emergency Medicine | Admitting: Emergency Medicine

## 2023-06-06 ENCOUNTER — Encounter (HOSPITAL_COMMUNITY): Payer: Self-pay

## 2023-06-06 ENCOUNTER — Other Ambulatory Visit: Payer: Self-pay

## 2023-06-06 DIAGNOSIS — Z5321 Procedure and treatment not carried out due to patient leaving prior to being seen by health care provider: Secondary | ICD-10-CM | POA: Diagnosis not present

## 2023-06-06 DIAGNOSIS — M545 Low back pain, unspecified: Secondary | ICD-10-CM | POA: Diagnosis present

## 2023-06-06 NOTE — ED Triage Notes (Signed)
 Pt here due to unbearable back pain. Pt had disc removal 6 years ago and believes its from surgery. Left lower back is where pain is mostly located Pt crying in triage.

## 2023-06-07 ENCOUNTER — Encounter: Payer: Self-pay | Admitting: Orthopedic Surgery

## 2023-06-07 DIAGNOSIS — M545 Low back pain, unspecified: Secondary | ICD-10-CM

## 2023-06-09 ENCOUNTER — Ambulatory Visit: Payer: Self-pay

## 2023-06-09 NOTE — Telephone Encounter (Signed)
  Chief Complaint: Back Pain Symptoms: severe lower back pain going into legs. Tingling in his toes.  Frequency: since last Tuesday Pertinent Negatives: Patient denies CP, SOB Disposition: [x] ED /[] Urgent Care (no appt availability in office) / [] Appointment(In office/virtual)/ []  [facility]/ [] Home Care/ [] Refused Recommended Disposition /[] [facility]/ []  Follow-up with PCP Additional Notes: patient's wife calling with concerns for severe back pain. Patient with hx of back surgery about six years ago, complains of severe back pain that started last Tuesday. Went to the ED on 5/16 via EMS. Was placed in a wheelchair for 2 hours but left before being seen. Wife states patient has been screaming out in pain and unable to ambulate effectively. Wife is asking for MRI referral for patient. Per protocol, patient is recommended to the ED today. Discussed with patient why going back to the ED is the best option. Instructed the wife to call 911 to transfer the patient to ED and for patient to be evaluated. Patient's wife verbalized understanding and all questions answered.    Copied from CRM [id number]. Topic: Clinical - Red Word Triage >> Jun 09, 2023 10:21 AM Lotus Round B wrote: [facility] that prompted transfer to Nurse Triage: 10/10 back pain Reason for Disposition  [1] SEVERE back pain (e.g., excruciating) AND [2] sudden onset AND [3] age > 60 years  Answer Assessment - Initial Assessment Questions 1. ONSET: "When did the pain begin?"      Back pain started last Tuesday 2. LOCATION: "Where does it hurt?" (upper, mid or lower back)     Lower back 3. SEVERITY: "How bad is the pain?"  (e.g., Scale 1-10; mild, moderate, or severe)   - MILD (1-3): Doesn't interfere with normal activities.    - MODERATE (4-7): Interferes with normal activities or awakens from sleep.    - SEVERE (8-10): Excruciating pain, unable to do any normal activities.      severe 4. PATTERN: "Is the pain  constant?" (e.g., yes, no; constant, intermittent)      constant 5. RADIATION: "Does the pain shoot into your legs or somewhere else?"     Pain shoot into legs and causes tingling in his toes 6. CAUSE:  "What do you think is causing the back pain?"      Unsure of what causes the exacerbation 7. BACK OVERUSE:  "Any recent lifting of heavy objects, strenuous work or exercise?"     Works as an Personnel officer 8. MEDICINES: "What have you taken so far for the pain?" (e.g., nothing, acetaminophen , NSAIDS)     Multiple pain medications with no help  9. NEUROLOGIC SYMPTOMS: "Do you have any weakness, numbness, or problems with bowel/bladder control?"     Wife is uncertain-she doesn't think so.  10. OTHER SYMPTOMS: "Do you have any other symptoms?" (e.g., fever, abdomen pain, burning with urination, blood in urine)       no  Protocols used: Back Pain-A-AH

## 2023-06-11 MED ORDER — GABAPENTIN 300 MG PO CAPS: 300.0000 mg | ORAL_CAPSULE | Freq: Three times a day (TID) | ORAL | 5 refills | Status: DC

## 2023-06-11 MED ORDER — PREDNISONE 10 MG (48) PO TBPK: ORAL_TABLET | Freq: Every day | ORAL | 0 refills | Status: DC

## 2023-06-11 MED ORDER — DIAZEPAM 5 MG PO TABS: 5.0000 mg | ORAL_TABLET | Freq: Four times a day (QID) | ORAL | 0 refills | Status: DC | PRN

## 2023-06-11 NOTE — Addendum Note (Signed)
 Addended by: Darrin Emerald on: 06/11/2023 08:59 AM   Modules accepted: Orders

## 2023-06-12 ENCOUNTER — Other Ambulatory Visit: Payer: Self-pay

## 2023-06-12 ENCOUNTER — Ambulatory Visit: Payer: Commercial Managed Care - PPO | Admitting: Internal Medicine

## 2023-06-12 ENCOUNTER — Emergency Department (HOSPITAL_COMMUNITY)
Admission: EM | Admit: 2023-06-12 | Discharge: 2023-06-12 | Disposition: A | Discharge status: Home / Self Care | Attending: Emergency Medicine | Admitting: Emergency Medicine

## 2023-06-12 DIAGNOSIS — D72829 Elevated white blood cell count, unspecified: Secondary | ICD-10-CM | POA: Diagnosis not present

## 2023-06-12 DIAGNOSIS — M545 Low back pain, unspecified: Secondary | ICD-10-CM | POA: Diagnosis present

## 2023-06-12 DIAGNOSIS — Z79899 Other long term (current) drug therapy: Secondary | ICD-10-CM | POA: Diagnosis not present

## 2023-06-12 DIAGNOSIS — I10 Essential (primary) hypertension: Secondary | ICD-10-CM | POA: Diagnosis not present

## 2023-06-12 DIAGNOSIS — F17210 Nicotine dependence, cigarettes, uncomplicated: Secondary | ICD-10-CM | POA: Insufficient documentation

## 2023-06-12 DIAGNOSIS — J449 Chronic obstructive pulmonary disease, unspecified: Secondary | ICD-10-CM | POA: Diagnosis not present

## 2023-06-12 LAB — CBC WITH DIFFERENTIAL/PLATELET
Abs Immature Granulocytes: 0.04 10*3/uL (ref 0.00–0.07)
Basophils Absolute: 0.1 10*3/uL (ref 0.0–0.1)
Basophils Relative: 1 %
Eosinophils Absolute: 0.1 10*3/uL (ref 0.0–0.5)
Eosinophils Relative: 1 %
HCT: 43.7 % (ref 39.0–52.0)
Hemoglobin: 15.6 g/dL (ref 13.0–17.0)
Immature Granulocytes: 0 %
Lymphocytes Relative: 20 %
Lymphs Abs: 2.2 10*3/uL (ref 0.7–4.0)
MCH: 33.2 pg (ref 26.0–34.0)
MCHC: 35.7 g/dL (ref 30.0–36.0)
MCV: 93 fL (ref 80.0–100.0)
Monocytes Absolute: 1.1 10*3/uL — ABNORMAL HIGH (ref 0.1–1.0)
Monocytes Relative: 10 %
Neutro Abs: 7.4 10*3/uL (ref 1.7–7.7)
Neutrophils Relative %: 68 %
Platelets: 313 10*3/uL (ref 150–400)
RBC: 4.7 MIL/uL (ref 4.22–5.81)
RDW: 11.9 % (ref 11.5–15.5)
WBC: 10.8 10*3/uL — ABNORMAL HIGH (ref 4.0–10.5)
nRBC: 0 % (ref 0.0–0.2)

## 2023-06-12 LAB — COMPREHENSIVE METABOLIC PANEL WITH GFR
ALT: 15 U/L (ref 0–44)
AST: 24 U/L (ref 15–41)
Albumin: 3.8 g/dL (ref 3.5–5.0)
Alkaline Phosphatase: 43 U/L (ref 38–126)
Anion gap: 12 (ref 5–15)
BUN: 13 mg/dL (ref 8–23)
CO2: 23 mmol/L (ref 22–32)
Calcium: 9.4 mg/dL (ref 8.9–10.3)
Chloride: 99 mmol/L (ref 98–111)
Creatinine, Ser: 0.86 mg/dL (ref 0.61–1.24)
GFR, Estimated: >60 mL/min (ref >60)
Glucose, Bld: 99 mg/dL (ref 70–99)
Potassium: 4.3 mmol/L (ref 3.5–5.1)
Sodium: 134 mmol/L — ABNORMAL LOW (ref 135–145)
Total Bilirubin: 1.3 mg/dL — ABNORMAL HIGH (ref 0.0–1.2)
Total Protein: 6.9 g/dL (ref 6.5–8.1)

## 2023-06-12 LAB — SEDIMENTATION RATE: Sed Rate: 5 mm/h (ref 0–16)

## 2023-06-12 LAB — LIPASE, BLOOD: Lipase: 55 U/L — ABNORMAL HIGH (ref 11–51)

## 2023-06-12 MED ORDER — BACLOFEN 10 MG PO TABS
10.0000 mg | ORAL_TABLET | Freq: Once | ORAL | Status: AC
Start: 2023-06-12 — End: 2023-06-12
  Administered 2023-06-12: 10 mg via ORAL
  Filled 2023-06-12: qty 1

## 2023-06-12 MED ORDER — KETOROLAC TROMETHAMINE 15 MG/ML IJ SOLN
15.0000 mg | Freq: Once | INTRAMUSCULAR | Status: AC
  Administered 2023-06-12: 15 mg via INTRAVENOUS
  Filled 2023-06-12: qty 1

## 2023-06-12 MED ORDER — ONDANSETRON 4 MG PO TBDP
4.0000 mg | ORAL_TABLET | Freq: Once | ORAL | Status: DC
  Filled 2023-06-12: qty 1

## 2023-06-12 MED ORDER — BACLOFEN 10 MG PO TABS: 10.0000 mg | ORAL_TABLET | Freq: Three times a day (TID) | ORAL | 0 refills | Status: DC | PRN

## 2023-06-12 NOTE — ED Notes (Signed)
 Pt states pain returning, EDP at bedside

## 2023-06-12 NOTE — Discharge Instructions (Addendum)
 For your back pain, you can take: - prednisone 50 mg (5 tablets) once daily for 5 days -Gabapentin  100 mg nightly, can increase to 100 mg 3 times a day if tolerated and needed - baclofen (muscle relaxer) 10 mg 3 times a day as needed  - Tylenol  1000 mg every 6 hours as needed - AFTER completing course of prednisone can take mobic  (meloxicam ) as needed  - Follow up with PCP

## 2023-06-12 NOTE — ED Triage Notes (Addendum)
 Pt. Stated, Ive had back pain for a week . I had back surgery about 6 years by Dr. Rochelle Chu . Called his office and they said to come here .No problem with urinating or having a bowel movement

## 2023-06-12 NOTE — ED Provider Notes (Incomplete)
 Patient is a 62 year old male presenting with worsening pain in his back over the last week.  Patient's back pain is in his left SI joint.  He does complain of intermittent tingling in his toes but not constant pain radiating down his leg.  He has no midline tenderness.  He has no flank tenderness or abdominal pain.  He does report yesterday he was having vomiting and felt a bit feverish but denies that today.  Patient has no risk factors for spinal infection.  He did see Dr. Phyllis Breeze and was prescribed Valium, gabapentin  and prednisone but his wife reports they are not sure how to take it because the directions are not clear.  Patient also has meloxicam  that he takes at home.  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  He always has stiffness in his back in the mornings but this pain is relatively new.   Almond Army, MD 06/16/23 [phone]

## 2023-06-12 NOTE — ED Provider Notes (Signed)
 [place] EMERGENCY DEPARTMENT AT [facility] Provider Note   CSN: [id number] Arrival date & time: 06/12/23  5638     History Degenerative disc disease, lumbar spinal fusion, HTN, BPH, tobacco use Chief Complaint  Patient presents with   Back Pain    Colin Ellis is a 63 y.o. male.  The patient, with a history of lumbar fusion, presents with lower back pain for the past week and nausea since yesterday. He is accompanied by his wife.  He experiences constant lower back pain on the left side, worsening in the morning and with any movement, making it difficult to get up and use the bathroom. This is the first occurrence of similar pain since his lumbar fusion surgery six years ago. Numbness in the left toes began a couple of days ago, with no leg weakness or loss of bowel or bladder control.  Admits to subjective fever and chills yesterday, has not taken any fever reducer. He experienced nausea and vomiting yesterday, preventing him from eating, though he was able to eat and keep it down this morning.  He takes meloxicam  daily and was recently prescribed prednisone, Valium, and increased dose of gabapentin  for the back pain.  States he took the prednisone yesterday but has not taken the valium or increased dose of gabapentin .  He has not taken any pain medication today. He smokes cigarettes and drinks beer and denies any drug use.     Back Pain      Home Medications Prior to Admission medications   Medication Sig Start Date End Date Taking? Authorizing Provider  albuterol  (VENTOLIN  HFA) 108 (90 Base) MCG/ACT inhaler Inhale 2 puffs into the lungs every 6 (six) hours as needed for wheezing or shortness of breath. 01/23/22   Lillia Reilly, MD  clobetasol  ointment (TEMOVATE ) 0.05 % Apply 1 Application topically 2 (two) times daily. 02/21/23   Meldon Sport, MD  cyanocobalamin (VITAMIN B12) 1000 MCG tablet Take 1,000 mcg by mouth daily.    [provider]  diazepam  (VALIUM) 5 MG tablet Take 1 tablet (5 mg total) by mouth every 6 (six) hours as needed for anxiety. 06/11/23   Darrin Emerald, MD  gabapentin  (NEURONTIN ) 300 MG capsule Take 1 capsule (300 mg total) by mouth 3 (three) times daily. 06/11/23   Darrin Emerald, MD  losartan  (COZAAR ) 100 MG tablet Take 1 tablet (100 mg total) by mouth daily. 05/30/23   Meldon Sport, MD  Melatonin 5 MG CAPS Take 3 each by mouth at bedtime as needed.    [provider]  meloxicam  (MOBIC ) 15 MG tablet Take 1 tablet (15 mg total) by mouth daily. 03/14/23   Meldon Sport, MD  Multiple Vitamin (MULTIVITAMIN) tablet Take 1 tablet by mouth daily.    [provider]  predniSONE (STERAPRED UNI-PAK 48 TAB) 10 MG (48) TBPK tablet Take by mouth daily. 10 mg ds 12 days 06/11/23   Darrin Emerald, MD      Allergies    Sulfa antibiotics    Review of Systems   Review of Systems  Musculoskeletal:  Positive for back pain.  As in HPI  Physical Exam Updated Vital Signs BP 134/78 (BP Location: Right Arm)   Pulse 89   Temp 98.2 F (36.8 C)   Resp (!) 26   SpO2 100%  Physical Exam Constitutional:      General: He is not in acute distress.    Appearance: He is not ill-appearing.  HENT:     Mouth/Throat:     Mouth: Mucous membranes are moist.  Eyes:     Conjunctiva/sclera: Conjunctivae normal.  Cardiovascular:     Rate and Rhythm: Normal rate and regular rhythm.     Heart sounds: No murmur heard. Pulmonary:     Effort: Pulmonary effort is normal. No respiratory distress.     Breath sounds: Normal breath sounds.  Abdominal:     General: Abdomen is flat. Bowel sounds are normal. There is no distension.     Palpations: Abdomen is soft.     Tenderness: There is no abdominal tenderness.  Musculoskeletal:     Cervical back: Neck supple.     Comments: No point tenderness to lumbar spine.  No pain with palpation of lumbar paraspinal muscles. Does have TTP over L SI joint Able to sit up and  stand up though with significant pain. 5/5 muscle strength of BLEs  Skin:    General: Skin is warm and dry.     Findings: No rash.  Neurological:     General: No focal deficit present.     Mental Status: He is alert.  Psychiatric:        Mood and Affect: Mood normal.        Behavior: Behavior normal.     ED Results / Procedures / Treatments   Labs (all labs ordered are listed, but only abnormal results are displayed) Labs Reviewed - No data to display  EKG None  Radiology No results found.  Procedures Procedures  None  Medications Ordered in ED Medications - No data to display  ED Course/ Medical Decision Making/ A&P   {                                Medical Decision Making 63 year old male with PMHx COPD, HTN, BPH, DDD, s/p lumbar spinal fusion, tobacco use, alcohol use, psoriasis this, arthritis, depression, cognitive decline presents with severe and worsening L sided low back pain x 1 week accompanied by subjective fever and chills and with nausea. Was vomiting yesterday which has resolved.   In ED, vitals are stable, he is afebrile without fever reducer.  Labs -CBC with only mild leukocytosis of 10.8, CMP and lipase non concerning. Exam reassuring with good muscle strength and no point tenderness of spine. No red flag symptoms including urinary retention, bowel incontinence or leg weakness.  Does have reproducible pain with palpation of left SI joint. Low concern for spinal abscess given no midline back pain, he is afebrile, no hx IV drug use with only minimal leukocytosis (in setting of taking prednisone) and ESR normal.   Low back pain most likely related to muscular strain and/or SI joint inflammation.  He was treated with IM Toradol and baclofen with significant improvement in pain and given Zofran  for nausea. He remained stable and did not require admission.  He was discharged with Rx baclofen and advised to take already prescribed prednisone 50 mg daily for 5  days along with gabapentin  100 mg nightly (can increase to 100 mg 3 times daily if needed )Tylenol  1000 mg every 6 hours as needed and to follow-up with PCP, may benefit from PT and SI joint injection.  He was advised not to take previously prescribed Valium with baclofen.  Per his wife, they did not pick up the Valium from the pharmacy.  Amount and/or Complexity of Data Reviewed Labs: ordered.  Risk Prescription drug management.    Final Clinical Impression(s) / ED Diagnoses Final diagnoses:  None    Rx / DC Orders ED Discharge Orders     None         Glenn Lange, DO 06/12/23 1354    Almond Army, MD 06/16/23 [id number]

## 2023-06-13 ENCOUNTER — Encounter: Admitting: Orthopedic Surgery

## 2023-06-14 ENCOUNTER — Encounter: Payer: Self-pay | Admitting: Internal Medicine

## 2023-06-17 ENCOUNTER — Ambulatory Visit (INDEPENDENT_AMBULATORY_CARE_PROVIDER_SITE_OTHER): Admitting: Internal Medicine

## 2023-06-17 ENCOUNTER — Encounter: Payer: Self-pay | Admitting: Internal Medicine

## 2023-06-17 VITALS — BP 129/80 | HR 66 | Ht 72.0 in | Wt 159.8 lb

## 2023-06-17 DIAGNOSIS — M51362 Other intervertebral disc degeneration, lumbar region with discogenic back pain and lower extremity pain: Secondary | ICD-10-CM | POA: Diagnosis not present

## 2023-06-17 DIAGNOSIS — Z09 Encounter for follow-up examination after completed treatment for conditions other than malignant neoplasm: Secondary | ICD-10-CM | POA: Diagnosis not present

## 2023-06-17 DIAGNOSIS — M5030 Other cervical disc degeneration, unspecified cervical region: Secondary | ICD-10-CM | POA: Diagnosis not present

## 2023-06-17 DIAGNOSIS — Z981 Arthrodesis status: Secondary | ICD-10-CM | POA: Diagnosis not present

## 2023-06-17 MED ORDER — BACLOFEN 10 MG PO TABS: 10.0000 mg | ORAL_TABLET | Freq: Two times a day (BID) | ORAL | 2 refills | Status: DC

## 2023-06-17 MED ORDER — PREDNISONE 10 MG (48) PO TBPK: ORAL_TABLET | Freq: Every day | ORAL | 0 refills | Status: DC

## 2023-06-17 NOTE — Patient Instructions (Addendum)
 Please take Prednisone as prescribed - 6 tablets for 2 days, then 5 tablets for 2 days, then 4 tablets for 2 days, 3 tablets for 2 days, 2 tablets of 2 days and 1 tablet for 2 days.  Please continue taking Gabapentin  and Baclofen as prescribed.  Okay to use warm compresses and/or back brace as needed for back pain.

## 2023-06-17 NOTE — Assessment & Plan Note (Signed)
 Has history of lumbar spinal fusion Had sacral fracture in 2023, had worsening of back pain 2 weeks ago Went to ER - was given Prednisone dose pak by Orthopedic surgeon On Gabapentin  300 mg TID Valium as muscle relaxer Needs to be seen by Spine surgeon - referred to [place] [facility] Referred to PT Advised to avoid heavy lifting and frequent bending - work excuse note provided

## 2023-06-17 NOTE — Telephone Encounter (Signed)
 scheduled

## 2023-06-17 NOTE — Progress Notes (Unsigned)
 Acute Office Visit  Subjective:    Patient ID: Colin Ellis, male    DOB: 12/22/1960, 63 y.o.   MRN: [id number]  Chief Complaint  Patient presents with   Medical Management of Chronic Issues    ED f/u from 06/12/2023. Pt reports sx of back pain still present and shoulder pain still present.     HPI Patient is in today for follow-up of recent ER visit on 06/12/23 for acute on chronic low back pain.  His pain is constant, sharp, radiating to bilateral LE and worse with bending, standing and walking.  He has tried taking meloxicam  without much relief.  He was given prednisone, baclofen and gabapentin  by orthopedic surgeon, but has not received much relief.  He has not been able to return to work due to severe low back pain.  Of note, he lifts and carries heavy weights at his workplace.  Denies any recent fall or injury.  Denies saddle anesthesia, urinary or stool incontinence.  He also has chronic neck pain, radiating to left shoulder area.  Pain is constant, sharp, and worse with neck movement.  He was evaluated by orthopedic surgeon, had CT of chest done to rule out clavicular fracture, which was negative for clavicular fracture, but did show DDD of cervical spine.  Past Medical History:  Diagnosis Date   ADH disorder    Arthritis    Electrical injury in adult    Electrician by trade, "several shocks during working career"    Psoriasis    Teeth missing due to trauma     Past Surgical History:  Procedure Laterality Date   BACK SURGERY     herniated disc   COLONOSCOPY  11/12/2010   Surgeon: Suzette Espy, MD;  pancolonic diverticulosis, otherwise normal exam.   COLONOSCOPY WITH PROPOFOL  N/A 07/09/2021   Procedure: COLONOSCOPY WITH PROPOFOL ;  Surgeon: Suzette Espy, MD;  Location: [facility];  Service: Endoscopy;  Laterality: N/A;  12:45pm    Family History  Problem Relation Age of Onset   Colon cancer Paternal Grandfather        71s or 61s   Anesthesia problems Neg Hx     Hypotension Neg Hx    Malignant hyperthermia Neg Hx    Pseudochol deficiency Neg Hx     Social History   Socioeconomic History   Marital status: Married    Spouse name: Not on file   Number of children: Not on file   Years of education: Not on file   Highest education level: 12th grade  Occupational History   Not on file  Tobacco Use   Smoking status: Every Day    Current packs/day: 1.00    Average packs/day: 1 pack/day for 40.0 years (40.0 ttl pk-yrs)    Types: Cigarettes   Smokeless tobacco: Never   Tobacco comments:    13 cigarettes a day as of 06/17/2023  Vaping Use   Vaping status: Never Used  Substance and Sexual Activity   Alcohol use: Yes    Alcohol/week: 4.0 standard drinks of alcohol    Types: 4 Cans of beer per week    Comment: 3-4 beers a night   Drug use: No   Sexual activity: Not on file  Other Topics Concern   Not on file  Social History Narrative   Not on file   Social Drivers of Health   Financial Resource Strain: Low Risk  (05/13/2022)   Overall Financial Resource Strain (CARDIA)    Difficulty  of Paying Living Expenses: Not hard at all  Food Insecurity: No Food Insecurity (05/13/2022)   Hunger Vital Sign    Worried About Running Out of Food in the Last Year: Never true    Ran Out of Food in the Last Year: Never true  Transportation Needs: Unmet Transportation Needs (05/13/2022)   PRAPARE - Administrator, Civil Service (Medical): Yes    Lack of Transportation (Non-Medical): No  Physical Activity: Sufficiently Active (05/13/2022)   Exercise Vital Sign    Days of Exercise per Week: 6 days    Minutes of Exercise per Session: 150+ min  Stress: No Stress Concern Present (05/13/2022)   [employer] of Occupational Health - Occupational Stress Questionnaire    Feeling of Stress : Only a little  Social Connections: Moderately Isolated (05/13/2022)   Social Connection and Isolation Panel [NHANES]    Frequency of Communication with  Friends and Family: More than three times a week    Frequency of Social Gatherings with Friends and Family: Patient declined    Attends Religious Services: Never    Database administrator or Organizations: No    Attends Engineer, structural: Not on file    Marital Status: Married  Catering manager Violence: Not on file    Outpatient Medications Prior to Visit  Medication Sig Dispense Refill   albuterol  (VENTOLIN  HFA) 108 (90 Base) MCG/ACT inhaler Inhale 2 puffs into the lungs every 6 (six) hours as needed for wheezing or shortness of breath. 8 g 2   clobetasol  ointment (TEMOVATE ) 0.05 % Apply 1 Application topically 2 (two) times daily. 60 g 1   cyanocobalamin (VITAMIN B12) 1000 MCG tablet Take 1,000 mcg by mouth daily.     diazepam (VALIUM) 5 MG tablet Take 1 tablet (5 mg total) by mouth every 6 (six) hours as needed for anxiety. 20 tablet 0   gabapentin  (NEURONTIN ) 300 MG capsule Take 1 capsule (300 mg total) by mouth 3 (three) times daily. 90 capsule 5   losartan  (COZAAR ) 100 MG tablet Take 1 tablet (100 mg total) by mouth daily. 90 tablet 1   Melatonin 5 MG CAPS Take 3 each by mouth at bedtime as needed.     meloxicam  (MOBIC ) 15 MG tablet Take 1 tablet (15 mg total) by mouth daily. 30 tablet 3   Multiple Vitamin (MULTIVITAMIN) tablet Take 1 tablet by mouth daily.     baclofen (LIORESAL) 10 MG tablet Take 1 tablet (10 mg total) by mouth 3 (three) times daily as needed for muscle spasms. 30 each 0   predniSONE (STERAPRED UNI-PAK 48 TAB) 10 MG (48) TBPK tablet Take by mouth daily. 10 mg ds 12 days 48 tablet 0   No facility-administered medications prior to visit.    Allergies  Allergen Reactions   Sulfa Antibiotics Nausea And Vomiting    Upset stomach    Review of Systems  Constitutional:  Positive for fatigue. Negative for chills and fever.  HENT:  Negative for congestion, sinus pressure and sinus pain.   Eyes:  Negative for pain and discharge.  Respiratory:  Negative  for cough and shortness of breath.   Cardiovascular:  Negative for chest pain and palpitations.  Gastrointestinal:  Negative for diarrhea, nausea and vomiting.  Genitourinary:  Negative for dysuria and hematuria.  Musculoskeletal:  Positive for arthralgias (Left clavicular and shoulder pain), back pain and gait problem.  Skin:  Positive for rash.  Neurological:  Negative for dizziness and weakness.  Psychiatric/Behavioral:  Positive for decreased concentration. Negative for agitation and confusion. The patient is nervous/anxious.        Objective:     Physical Exam Constitutional:      General: He is not in acute distress.    Appearance: He is not diaphoretic.  HENT:     Head: Normocephalic.     Nose: No congestion.     Mouth/Throat:     Mouth: Mucous membranes are moist.     Pharynx: No posterior oropharyngeal erythema.  Eyes:     General: No scleral icterus.    Extraocular Movements: Extraocular movements intact.  Cardiovascular:     Rate and Rhythm: Normal rate and regular rhythm.     Heart sounds: Normal heart sounds. No murmur heard. Pulmonary:     Breath sounds: Normal breath sounds. No wheezing or rales.  Musculoskeletal:     Left shoulder: No swelling. Normal range of motion.     Cervical back: Neck supple. Tenderness present.     Lumbar back: Tenderness present. Decreased range of motion. Positive right straight leg raise test and positive left straight leg raise test.     Right lower leg: No edema.     Left lower leg: No edema.     Comments: Left mid clavicular area tenderness  Skin:    General: Skin is warm.     Findings: Rash (Erythematous patches with silvery scaling over bilateral shin) present.  Neurological:     General: No focal deficit present.     Mental Status: He is alert and oriented to person, place, and time.     Sensory: No sensory deficit.     Motor: No weakness.  Psychiatric:        Mood and Affect: Mood is anxious.        Behavior: Behavior  is cooperative.     BP 129/80   Pulse 66   Ht 6' (1.829 m)   Wt 159 lb 12.8 oz (72.5 kg)   SpO2 98%   BMI 21.67 kg/m  Wt Readings from Last 3 Encounters:  06/17/23 159 lb 12.8 oz (72.5 kg)  06/06/23 160 lb (72.6 kg)  05/30/23 156 lb (70.8 kg)        Assessment & Plan:   Problem List Items Addressed This Visit       Musculoskeletal and Integument   DDD (degenerative disc disease), cervical   Tingling around neck and upper left arm could be due to cervical spondylosis Has h/o alcohol abuse - peripheral neuropathy can be a factor as well Advised to take Vitamin B12 500 mcg QD Advised to continue gabapentin  300 mg TID Baclofen as needed for neck muscle spasms      Relevant Medications   predniSONE (STERAPRED UNI-PAK 48 TAB) 10 MG (48) TBPK tablet   baclofen (LIORESAL) 10 MG tablet   Other Relevant Orders   Ambulatory referral to Spine Surgery   Ambulatory referral to Physical Therapy   Degeneration of intervertebral disc of lumbar region with discogenic back pain and lower extremity pain - Primary   Acute on chronic low back pain, likely due to heavy lifting at his workplace He has a history of sacral fracture and lumbar fusion surgery Continue gabapentin  300 mg 3 times daily Baclofen as needed for muscle spasms Avoid opiate medications due to history of alcohol abuse since      Relevant Medications   predniSONE (STERAPRED UNI-PAK 48 TAB) 10 MG (48) TBPK tablet   baclofen (LIORESAL) 10 MG tablet  Other Relevant Orders   Ambulatory referral to Physical Therapy     Other   S/P lumbar spinal fusion   Has history of lumbar spinal fusion Had sacral fracture in 2023, had worsening of back pain 2 weeks ago Went to ER - was given Prednisone dose pak by Orthopedic surgeon On Gabapentin  300 mg TID Valium as muscle relaxer Needs to be seen by Spine surgeon - referred to [place] [facility] Referred to PT Advised to avoid heavy lifting and frequent bending - work  excuse note provided      Relevant Orders   Ambulatory referral to Spine Surgery   Ambulatory referral to Physical Therapy   Encounter for examination following treatment at hospital   ER chart reviewed Went to ER for acute on chronic low back pain, was advised to continue medications prescribed by orthopedic surgeon        Meds ordered this encounter  Medications   predniSONE (STERAPRED UNI-PAK 48 TAB) 10 MG (48) TBPK tablet    Sig: Take by mouth daily. 10 mg ds 12 days    Dispense:  48 tablet    Refill:  0   baclofen (LIORESAL) 10 MG tablet    Sig: Take 1 tablet (10 mg total) by mouth 2 (two) times daily.    Dispense:  60 each    Refill:  2     Altha Sweitzer Alyssa Backbone, MD

## 2023-06-18 ENCOUNTER — Telehealth: Payer: Self-pay

## 2023-06-18 DIAGNOSIS — Z09 Encounter for follow-up examination after completed treatment for conditions other than malignant neoplasm: Secondary | ICD-10-CM | POA: Insufficient documentation

## 2023-06-18 DIAGNOSIS — M51362 Other intervertebral disc degeneration, lumbar region with discogenic back pain and lower extremity pain: Secondary | ICD-10-CM

## 2023-06-18 NOTE — Assessment & Plan Note (Signed)
 Acute on chronic low back pain, likely due to heavy lifting at his workplace He has a history of sacral fracture and lumbar fusion surgery Continue gabapentin  300 mg 3 times daily Baclofen as needed for muscle spasms Avoid opiate medications due to history of alcohol abuse since

## 2023-06-18 NOTE — Assessment & Plan Note (Signed)
 Tingling around neck and upper left arm could be due to cervical spondylosis Has h/o alcohol abuse - peripheral neuropathy can be a factor as well Advised to take Vitamin B12 500 mcg QD Advised to continue gabapentin  300 mg TID Baclofen as needed for neck muscle spasms

## 2023-06-18 NOTE — Assessment & Plan Note (Signed)
 ER chart reviewed Went to ER for acute on chronic low back pain, was advised to continue medications prescribed by orthopedic surgeon

## 2023-06-19 ENCOUNTER — Telehealth: Payer: Self-pay

## 2023-06-19 ENCOUNTER — Encounter: Payer: Self-pay | Admitting: Internal Medicine

## 2023-06-19 NOTE — Progress Notes (Signed)
 Complex Care Management Note  Care Guide Note 06/19/2023 Name: Colin Ellis MRN: [id number] DOB: 1960-11-07  [name] is a 63 y.o. year old male who sees Meldon Sport, MD for primary care. I reached out to Rainelle Bur by phone today to offer complex care management services.  Mr. Pursley was given information about Complex Care Management services today including:   The Complex Care Management services include support from the care team which includes your Nurse Care Manager, Clinical Social Worker, or Pharmacist.  The Complex Care Management team is here to help remove barriers to the health concerns and goals most important to you. Complex Care Management services are voluntary, and the patient may decline or stop services at any time by request to their care team member.   Complex Care Management Consent Status: Patient agreed to services and verbal consent obtained.   Follow up plan:  Telephone appointment with complex care management team member scheduled for:  06/27/2023  Encounter Outcome:  Patient Scheduled  Lenton Rail , RMA     [place]  [facility], [facility] Guide  Direct Dial: [phone]  Website: [email or web address]

## 2023-06-25 ENCOUNTER — Telehealth: Payer: Self-pay | Admitting: Internal Medicine

## 2023-06-25 ENCOUNTER — Encounter: Payer: Self-pay | Admitting: Internal Medicine

## 2023-06-25 NOTE — Telephone Encounter (Signed)
 GUARDIAN / STD  Noted  Copied Sleeved  Original in PCP box Copy front desk folder

## 2023-06-26 NOTE — Telephone Encounter (Signed)
 In dr patel folder awaiting his return

## 2023-06-27 ENCOUNTER — Other Ambulatory Visit: Payer: Self-pay | Admitting: Licensed Clinical Social Worker

## 2023-06-27 NOTE — Patient Instructions (Signed)
 Visit Information  Thank you for taking time to visit with me today. Please don't hesitate to contact me if I can be of assistance to you before our next scheduled appointment.   Please call the care guide team at [phone] if you need to cancel or reschedule your appointment.   Following is a copy of your care plan:   Goals Addressed             This Visit's Progress    COMPLETED: VBCI Social Work Care Plan       Problems:   Financial Strain   CSW Clinical Goal(s):   Over the next 4 weeks the Patient will explore community resource options for unmet needs related to Financial Strain .  Interventions:  Social Determinants of Health in Patient with Degenerative Disk Disease: SDOH assessments completed: Financial Strain  Evaluation of current treatment plan related to unmet needs Encouraged to apply for Medicaid - reviewed application process Reviewed available community resources including food banks and salvation army  Patient Goals/Self-Care Activities:  Review Agricultural engineer on community resources.  Plan:   No further follow up required: Please contact care guide if you would like to reconnect.        Please call the Suicide and Crisis Lifeline: 988 if you are experiencing a Mental Health or Behavioral Health Crisis or need someone to talk to.  Patient verbalizes understanding of instructions and care plan provided today and agrees to view in MyChart. Active MyChart status and patient understanding of how to access instructions and care plan via MyChart confirmed with patient.     Hale Level, LCSW [place] [facility], [facility] Licensed Clinical Social Worker Care Coordinator [phone]

## 2023-06-27 NOTE — Patient Outreach (Signed)
 Complex Care Management   Visit Note  06/27/2023  Name:  Colin Ellis MRN: [id number] DOB: [birth year]  Situation: Referral received for Complex Care Management related to SDOH Barriers:  Financial Resource Strain I obtained verbal consent from Patient.  Visit completed with patient  on the phone  Background:   Past Medical History:  Diagnosis Date   ADH disorder    Arthritis    Electrical injury in adult    Electrician by trade, "several shocks during working career"    Psoriasis    Teeth missing due to trauma     Assessment: Patient Reported Symptoms:  Cognitive Cognitive Status: Alert and oriented to person, place, and time, Insightful and able to interpret abstract concepts, Normal speech and language skills Cognitive/Intellectual Conditions Management [RPT]: None reported or documented in medical history or problem list   Health Maintenance Behaviors: Annual physical exam Healing Pattern: Unsure Health Facilitated by: Pain control, Stress management  Neurological Neurological Review of Symptoms: No symptoms reported    HEENT HEENT Symptoms Reported: No symptoms reported      Cardiovascular Cardiovascular Symptoms Reported: No symptoms reported    Respiratory Respiratory Symptoms Reported: No symptoms reported    Endocrine Patient reports the following symptoms related to hypoglycemia or hyperglycemia : No symptoms reported    Gastrointestinal Gastrointestinal Symptoms Reported: No symptoms reported      Genitourinary Genitourinary Symptoms Reported: No symptoms reported    Integumentary Integumentary Symptoms Reported: No symptoms reported    Musculoskeletal Musculoskelatal Symptoms Reviewed: Difficulty walking, Weakness Additional Musculoskeletal Details: Chronic Back Pain - degenerative disk disease - scheduling follow up with Ortho Musculoskeletal Conditions: Back pain, Mobility limited Musculoskeletal Management Strategies: Coping strategies, Medication  therapy Musculoskeletal Self-Management Outcome: 3 (uncertain) Musculoskeletal Comment: Recently went to ED for back pain - directed reschedule follow up with Ortho Falls in the past year?: No Number of falls in past year: 1 or less Was there an injury with Fall?: No Fall Risk Category Calculator: 0 Patient Fall Risk Level: Low Fall Risk Patient at Risk for Falls Due to: No Fall Risks Fall risk Follow up: Falls evaluation completed  Psychosocial Psychosocial Symptoms Reported: Depression - if selected complete PHQ 2-9, Anxiety - if selected complete GAD Additional Psychological Details: Pt reported stress and depressive symptoms due to back pain and financial strain - CSW offered MH interventions but pt declined - stating he wanted to concentrate on managing his pain. Behavioral Management Strategies: Support system Behavioral Health Self-Management Outcome: 3 (uncertain) Behavioral Health Comment: Pt encouraged to reach out to CSW if he would like to address MH Major Change/Loss/Stressor/Fears (CP): Medical condition, self Techniques to Cope with Loss/Stress/Change: Diversional activities Quality of Family Relationships: helpful, involved Do you feel physically threatened by others?: No      06/27/2023    1:33 PM  Depression screen PHQ 2/9  Decreased Interest 0  Down, Depressed, Hopeless 0  PHQ - 2 Score 0    There were no vitals filed for this visit.  Medications Reviewed Today     Reviewed by Jens Molder, LCSW (Social Worker) on 06/27/23 at 1321  Med List Status: <None>   Medication Order Taking? Sig Documenting Provider Last Dose Status Informant  albuterol  (VENTOLIN  HFA) 108 (90 Base) MCG/ACT inhaler [id number] Yes Inhale 2 puffs into the lungs every 6 (six) hours as needed for wheezing or shortness of breath. Lillia Reilly, MD Taking Active   baclofen  (LIORESAL ) 10 MG tablet [id number]  Take 1 tablet (10 mg  total) by mouth 2 (two) times daily. Meldon Sport, MD  Active    clobetasol  ointment (TEMOVATE ) 0.05 % [id number] Yes Apply 1 Application topically 2 (two) times daily. Meldon Sport, MD Taking Active   cyanocobalamin (VITAMIN B12) 1000 MCG tablet [id number] Yes Take 1,000 mcg by mouth daily. [provider] Taking Active   diazepam  (VALIUM ) 5 MG tablet [id number] Yes Take 1 tablet (5 mg total) by mouth every 6 (six) hours as needed for anxiety. Darrin Emerald, MD Taking Active   gabapentin  (NEURONTIN ) 300 MG capsule [id number] Yes Take 1 capsule (300 mg total) by mouth 3 (three) times daily. Darrin Emerald, MD Taking Active   losartan  (COZAAR ) 100 MG tablet [id number] Yes Take 1 tablet (100 mg total) by mouth daily. Meldon Sport, MD Taking Active   Melatonin 5 MG CAPS [id number] Yes Take 3 each by mouth at bedtime as needed. [provider] Taking Active   meloxicam  (MOBIC ) 15 MG tablet [id number] Yes Take 1 tablet (15 mg total) by mouth daily. Meldon Sport, MD Taking Active   Multiple Vitamin (MULTIVITAMIN) tablet [id number] Yes Take 1 tablet by mouth daily. [provider] Taking Active   predniSONE  (STERAPRED UNI-PAK 48 TAB) 10 MG (48) TBPK tablet [id number]  Take by mouth daily. 10 mg ds 12 days Meldon Sport, MD  Active             Recommendation:   CSW and pt discussed his SDOH needs including food insecurity and financial strain. We reviewed community resources options and CSW provided pt with local resources. CSW also encouraged pt to apply for Medicaid through local DSS office - pt agreed to this. CSW also encouraged follow up with Ortho to address ongoing back pain.   Follow Up Plan:   Patient has met all care management goals. Care Management case will be closed. Patient has been provided contact information should new needs arise.   Hale Level, LCSW [facility], [facility] Licensed Clinical Social Worker Care Coordinator [phone]

## 2023-06-30 ENCOUNTER — Encounter: Payer: Self-pay | Admitting: Internal Medicine

## 2023-06-30 ENCOUNTER — Ambulatory Visit: Admitting: Internal Medicine

## 2023-06-30 VITALS — BP 142/86 | HR 76 | Ht 71.0 in | Wt 167.6 lb

## 2023-06-30 DIAGNOSIS — Z981 Arthrodesis status: Secondary | ICD-10-CM | POA: Diagnosis not present

## 2023-06-30 DIAGNOSIS — M25552 Pain in left hip: Secondary | ICD-10-CM | POA: Diagnosis not present

## 2023-06-30 DIAGNOSIS — K219 Gastro-esophageal reflux disease without esophagitis: Secondary | ICD-10-CM | POA: Insufficient documentation

## 2023-06-30 DIAGNOSIS — W19XXXA Unspecified fall, initial encounter: Secondary | ICD-10-CM | POA: Insufficient documentation

## 2023-06-30 DIAGNOSIS — M51362 Other intervertebral disc degeneration, lumbar region with discogenic back pain and lower extremity pain: Secondary | ICD-10-CM

## 2023-06-30 MED ORDER — OMEPRAZOLE 40 MG PO CPDR: 40.0000 mg | DELAYED_RELEASE_CAPSULE | Freq: Every day | ORAL | 3 refills | Status: DC

## 2023-06-30 MED ORDER — TRAMADOL HCL 50 MG PO TABS: 50.0000 mg | ORAL_TABLET | Freq: Two times a day (BID) | ORAL | 0 refills | Status: AC | PRN

## 2023-06-30 NOTE — Telephone Encounter (Signed)
 Appt scheduled

## 2023-06-30 NOTE — Progress Notes (Signed)
 Acute Office Visit  Subjective:    Patient ID: Colin Ellis, male    DOB: 1960-05-25, 63 y.o.   MRN: [id number]  Chief Complaint  Patient presents with   Medical Management of Chronic Issues    Having medication side effect    Fall    Pt reports a fall 3-4 days ago , has left side pain and left pain hip.     HPI Patient is in today for follow-up of acute on chronic low back pain since 06/03/23.  His pain is constant, sharp, radiating to bilateral LE and worse with bending, standing and walking.  He has tried taking meloxicam  without much relief.  He had epigastric pain with meloxicam .  He was given prednisone , baclofen  and gabapentin  by orthopedic surgeon, and has noticed some improvement in back pain since the last visit.  He still has not been able to return to work due to severe low back pain.  Of note, he lifts and carries heavy weights at his workplace.  Denies any recent fall or injury.  Denies saddle anesthesia, urinary or stool incontinence.  He was referred to [facility], but has not been able to schedule appointment yet due to financial constraints.  He has not started physical therapy yet as well.  He also has chronic neck pain, radiating to left shoulder area.  Pain is constant, sharp, and worse with neck movement.  He was evaluated by orthopedic surgeon, had CT of chest done to rule out clavicular fracture, which was negative for clavicular fracture, but did show DDD of cervical spine.  He had a fall on 06/27/23 at home from the stairs.  He fell on his left side, hitting the left rib cage and hip area.  He had bruising over the left lower ribs area, which is slightly better today compared to the day of the fall.  Left rib area pain is constant, sharp, nonradiating and he has pain with breathing, coughing and sneezing.  He also reports left hip area pain, which is constant, sharp, radiating towards LLE and worse with bending.  Past Medical History:  Diagnosis Date    ADH disorder    Arthritis    Electrical injury in adult    Electrician by trade, "several shocks during working career"    Psoriasis    Teeth missing due to trauma     Past Surgical History:  Procedure Laterality Date   BACK SURGERY     herniated disc   COLONOSCOPY  11/12/2010   Surgeon: Suzette Espy, MD;  pancolonic diverticulosis, otherwise normal exam.   COLONOSCOPY WITH PROPOFOL  N/A 07/09/2021   Procedure: COLONOSCOPY WITH PROPOFOL ;  Surgeon: Suzette Espy, MD;  Location: [facility];  Service: Endoscopy;  Laterality: N/A;  12:45pm    Family History  Problem Relation Age of Onset   Colon cancer Paternal Grandfather        37s or 4s   Anesthesia problems Neg Hx    Hypotension Neg Hx    Malignant hyperthermia Neg Hx    Pseudochol deficiency Neg Hx     Social History   Socioeconomic History   Marital status: Married    Spouse name: Not on file   Number of children: Not on file   Years of education: Not on file   Highest education level: 12th grade  Occupational History   Not on file  Tobacco Use   Smoking status: Every Day    Current packs/day: 1.00  Average packs/day: 1 pack/day for 40.0 years (40.0 ttl pk-yrs)    Types: Cigarettes   Smokeless tobacco: Never   Tobacco comments:    13 cigarettes a day as of 06/30/23  Vaping Use   Vaping status: Never Used  Substance and Sexual Activity   Alcohol use: Yes    Alcohol/week: 4.0 standard drinks of alcohol    Types: 4 Cans of beer per week    Comment: 3-4 beers a night   Drug use: No   Sexual activity: Not on file  Other Topics Concern   Not on file  Social History Narrative   Not on file   Social Drivers of Health   Financial Resource Strain: Low Risk  (05/13/2022)   Overall Financial Resource Strain (CARDIA)    Difficulty of Paying Living Expenses: Not hard at all  Food Insecurity: Food Insecurity Present (06/27/2023)   Hunger Vital Sign    Worried About Running Out of Food in the Last Year:  Sometimes true    Ran Out of Food in the Last Year: Sometimes true  Transportation Needs: Unmet Transportation Needs (06/27/2023)   PRAPARE - Administrator, Civil Service (Medical): Yes    Lack of Transportation (Non-Medical): No  Physical Activity: Sufficiently Active (05/13/2022)   Exercise Vital Sign    Days of Exercise per Week: 6 days    Minutes of Exercise per Session: 150+ min  Stress: No Stress Concern Present (05/13/2022)   [employer] of Occupational Health - Occupational Stress Questionnaire    Feeling of Stress : Only a little  Social Connections: Moderately Isolated (05/13/2022)   Social Connection and Isolation Panel [NHANES]    Frequency of Communication with Friends and Family: More than three times a week    Frequency of Social Gatherings with Friends and Family: Patient declined    Attends Religious Services: Never    Database administrator or Organizations: No    Attends Engineer, structural: Not on file    Marital Status: Married  Catering manager Violence: Not At Risk (06/27/2023)   Humiliation, Afraid, Rape, and Kick questionnaire    Fear of Current or Ex-Partner: No    Emotionally Abused: No    Physically Abused: No    Sexually Abused: No    Outpatient Medications Prior to Visit  Medication Sig Dispense Refill   albuterol  (VENTOLIN  HFA) 108 (90 Base) MCG/ACT inhaler Inhale 2 puffs into the lungs every 6 (six) hours as needed for wheezing or shortness of breath. 8 g 2   baclofen  (LIORESAL ) 10 MG tablet Take 1 tablet (10 mg total) by mouth 2 (two) times daily. 60 each 2   clobetasol  ointment (TEMOVATE ) 0.05 % Apply 1 Application topically 2 (two) times daily. 60 g 1   cyanocobalamin (VITAMIN B12) 1000 MCG tablet Take 1,000 mcg by mouth daily.     diazepam  (VALIUM ) 5 MG tablet Take 1 tablet (5 mg total) by mouth every 6 (six) hours as needed for anxiety. 20 tablet 0   gabapentin  (NEURONTIN ) 300 MG capsule Take 1 capsule (300 mg total) by  mouth 3 (three) times daily. 90 capsule 5   losartan  (COZAAR ) 100 MG tablet Take 1 tablet (100 mg total) by mouth daily. 90 tablet 1   Melatonin 5 MG CAPS Take 3 each by mouth at bedtime as needed.     Multiple Vitamin (MULTIVITAMIN) tablet Take 1 tablet by mouth daily.     predniSONE  (STERAPRED UNI-PAK 48 TAB) 10 MG (  48) TBPK tablet Take by mouth daily. 10 mg ds 12 days 48 tablet 0   meloxicam  (MOBIC ) 15 MG tablet Take 1 tablet (15 mg total) by mouth daily. (Patient not taking: Reported on 06/30/2023) 30 tablet 3   No facility-administered medications prior to visit.    Allergies  Allergen Reactions   Sulfa Antibiotics Nausea And Vomiting    Upset stomach    Review of Systems  Constitutional:  Positive for fatigue. Negative for chills and fever.  HENT:  Negative for congestion, sinus pressure and sinus pain.   Eyes:  Negative for pain and discharge.  Respiratory:  Negative for cough and shortness of breath.   Cardiovascular:  Negative for chest pain and palpitations.  Gastrointestinal:  Negative for diarrhea, nausea and vomiting.  Genitourinary:  Negative for dysuria and hematuria.  Musculoskeletal:  Positive for arthralgias (Left clavicular and shoulder pain), back pain and gait problem.  Skin:  Positive for rash.  Neurological:  Negative for dizziness and weakness.  Psychiatric/Behavioral:  Positive for decreased concentration. Negative for agitation and confusion. The patient is nervous/anxious.        Objective:     Physical Exam Constitutional:      General: He is not in acute distress.    Appearance: He is not diaphoretic.  HENT:     Head: Normocephalic.     Nose: No congestion.     Mouth/Throat:     Mouth: Mucous membranes are moist.     Pharynx: No posterior oropharyngeal erythema.  Eyes:     General: No scleral icterus.    Extraocular Movements: Extraocular movements intact.  Cardiovascular:     Rate and Rhythm: Normal rate and regular rhythm.     Heart  sounds: Normal heart sounds. No murmur heard. Pulmonary:     Breath sounds: Normal breath sounds. No wheezing or rales.  Chest:     Chest wall: Tenderness (Lower left side) present.  Musculoskeletal:     Left shoulder: No swelling. Normal range of motion.     Cervical back: Neck supple. Tenderness present.     Lumbar back: Tenderness present. Decreased range of motion. Positive right straight leg raise test and positive left straight leg raise test.     Right lower leg: No edema.     Left lower leg: No edema.     Comments: Left mid clavicular area tenderness  Skin:    General: Skin is warm.     Findings: Rash (Erythematous patches with silvery scaling over bilateral shin) present.  Neurological:     General: No focal deficit present.     Mental Status: He is alert and oriented to person, place, and time.     Sensory: No sensory deficit.     Motor: No weakness.  Psychiatric:        Mood and Affect: Mood is anxious.        Behavior: Behavior is cooperative.     BP (!) 142/86 (BP Location: Right Arm)   Pulse 76   Ht 5\' 11"  (1.803 m)   Wt 167 lb 9.6 oz (76 kg)   SpO2 98%   BMI 23.38 kg/m  Wt Readings from Last 3 Encounters:  06/30/23 167 lb 9.6 oz (76 kg)  06/17/23 159 lb 12.8 oz (72.5 kg)  06/06/23 160 lb (72.6 kg)        Assessment & Plan:   Problem List Items Addressed This Visit       Digestive   Gastroesophageal reflux disease  Has longstanding history of alcohol abuse and NSAID use Currently has to take meloxicam  for acute low back pain, left rib pain and left hip pain Started omeprazole 40 mg QD for GERD Avoid hot and spicy food      Relevant Medications   omeprazole (PRILOSEC) 40 MG capsule     Musculoskeletal and Integument   Degeneration of intervertebral disc of lumbar region with discogenic back pain and lower extremity pain - Primary   Acute on chronic low back pain, likely due to heavy lifting at his workplace He has a history of sacral fracture  and lumbar fusion surgery Continue gabapentin  300 mg 3 times daily Baclofen  as needed for muscle spasms Tramadol  as needed for severe pain, has quit alcohol use now Needs spine surgery evaluation - may need MRI of lumbar spine, has hardware in place, will defer to spine surgery Needs to start physical therapy (referral was placed in the last visit)      Relevant Medications   traMADol  (ULTRAM ) 50 MG tablet     Other   S/P lumbar spinal fusion   Has history of lumbar spinal fusion Had sacral fracture in 2023, had worsening of back pain 2 weeks ago Went to ER - was given Prednisone  dose pak by Orthopedic surgeon On Gabapentin  300 mg TID Valium  as muscle relaxer Needs to be seen by Spine surgeon - referred to [facility] Referred to PT Advised to avoid heavy lifting and frequent bending - work excuse note provided, FMLA extended for additional 4 weeks      Fall   Likely mechanical fall Check x-ray of ribs and pelvis Meloxicam  as needed for mild to moderate pain, tramadol  as needed for severe pain Gabapentin  300 mg 3 times daily      Relevant Orders   DG Pelvis 1-2 Views   DG Ribs Unilateral Left   Other Visit Diagnoses       Left hip pain       Relevant Medications   traMADol  (ULTRAM ) 50 MG tablet   Other Relevant Orders   DG Pelvis 1-2 Views         Meds ordered this encounter  Medications   traMADol  (ULTRAM ) 50 MG tablet    Sig: Take 1 tablet (50 mg total) by mouth every 12 (twelve) hours as needed for up to 10 days.    Dispense:  20 tablet    Refill:  0   omeprazole (PRILOSEC) 40 MG capsule    Sig: Take 1 capsule (40 mg total) by mouth daily.    Dispense:  30 capsule    Refill:  3     Alwilda Gilland Alyssa Backbone, MD

## 2023-06-30 NOTE — Assessment & Plan Note (Signed)
 Has longstanding history of alcohol abuse and NSAID use Currently has to take meloxicam  for acute low back pain, left rib pain and left hip pain Started omeprazole 40 mg QD for GERD Avoid hot and spicy food

## 2023-06-30 NOTE — Assessment & Plan Note (Signed)
 Likely mechanical fall Check x-ray of ribs and pelvis Meloxicam  as needed for mild to moderate pain, tramadol  as needed for severe pain Gabapentin  300 mg 3 times daily

## 2023-06-30 NOTE — Patient Instructions (Signed)
 Please continue taking Gabapentin  and Baclofen  as prescribed.  Please take Tramadol  for severe pain.  Please take Omeprazole for abdominal pain.

## 2023-06-30 NOTE — Assessment & Plan Note (Addendum)
 Has history of lumbar spinal fusion Had sacral fracture in 2023, had worsening of back pain 2 weeks ago Went to ER - was given Prednisone  dose pak by Orthopedic surgeon On Gabapentin  300 mg TID Valium  as muscle relaxer Needs to be seen by Spine surgeon - referred to [facility] Referred to PT Advised to avoid heavy lifting and frequent bending - work excuse note provided, FMLA extended for additional 4 weeks

## 2023-06-30 NOTE — Assessment & Plan Note (Addendum)
 Acute on chronic low back pain, likely due to heavy lifting at his workplace He has a history of sacral fracture and lumbar fusion surgery Continue gabapentin  300 mg 3 times daily Baclofen  as needed for muscle spasms Tramadol  as needed for severe pain, has quit alcohol use now Needs spine surgery evaluation - may need MRI of lumbar spine, has hardware in place, will defer to spine surgery Needs to start physical therapy (referral was placed in the last visit)

## 2023-07-01 NOTE — Telephone Encounter (Unsigned)
 Copied from CRM [id number]. Topic: General - Other >> Jul 01, 2023  3:46 PM Colin Ellis wrote: Reason for CRM: Patient received message about work note, will come and pick up.

## 2023-07-01 NOTE — Telephone Encounter (Signed)
 Colin Ellis wife came by and picked up forms.

## 2023-07-11 ENCOUNTER — Ambulatory Visit (HOSPITAL_COMMUNITY)
Admission: RE | Admit: 2023-07-11 | Discharge: 2023-07-11 | Disposition: A | Discharge status: Home / Self Care | Source: Ambulatory Visit | Attending: Internal Medicine | Admitting: Internal Medicine

## 2023-07-11 ENCOUNTER — Encounter: Payer: Self-pay | Admitting: Internal Medicine

## 2023-07-11 DIAGNOSIS — R0789 Other chest pain: Secondary | ICD-10-CM | POA: Diagnosis not present

## 2023-07-11 DIAGNOSIS — M25759 Osteophyte, unspecified hip: Secondary | ICD-10-CM | POA: Diagnosis not present

## 2023-07-11 DIAGNOSIS — M25552 Pain in left hip: Secondary | ICD-10-CM

## 2023-07-11 DIAGNOSIS — M160 Bilateral primary osteoarthritis of hip: Secondary | ICD-10-CM | POA: Diagnosis not present

## 2023-07-11 DIAGNOSIS — W109XXA Fall (on) (from) unspecified stairs and steps, initial encounter: Secondary | ICD-10-CM | POA: Diagnosis not present

## 2023-07-11 DIAGNOSIS — W19XXXA Unspecified fall, initial encounter: Secondary | ICD-10-CM

## 2023-07-12 ENCOUNTER — Encounter: Payer: Self-pay | Admitting: Internal Medicine

## 2023-07-14 ENCOUNTER — Telehealth: Payer: Self-pay | Admitting: Internal Medicine

## 2023-07-14 ENCOUNTER — Other Ambulatory Visit: Payer: Self-pay | Admitting: Internal Medicine

## 2023-07-14 ENCOUNTER — Other Ambulatory Visit: Payer: Self-pay

## 2023-07-14 DIAGNOSIS — M898X1 Other specified disorders of bone, shoulder: Secondary | ICD-10-CM

## 2023-07-14 DIAGNOSIS — M51362 Other intervertebral disc degeneration, lumbar region with discogenic back pain and lower extremity pain: Secondary | ICD-10-CM

## 2023-07-14 MED ORDER — MELOXICAM 15 MG PO TABS: 15.0000 mg | ORAL_TABLET | Freq: Every day | ORAL | 3 refills | Status: DC

## 2023-07-14 NOTE — Telephone Encounter (Signed)
 [facility] Forms Noted Copied Scanned Original in provider box Copy at front desk

## 2023-07-14 NOTE — Telephone Encounter (Signed)
 Refills sent

## 2023-07-15 ENCOUNTER — Encounter: Payer: Self-pay | Admitting: Internal Medicine

## 2023-07-16 NOTE — Telephone Encounter (Signed)
 Letter sent to patient.

## 2023-07-16 NOTE — Telephone Encounter (Signed)
 Faxed with confirmation to [phone] Paperwork in scans box

## 2023-07-17 ENCOUNTER — Telehealth: Payer: Self-pay

## 2023-07-17 NOTE — Telephone Encounter (Signed)
 Copied from CRM [id number]. Topic: Medical Record Request - Records Request >> Jul 17, 2023  3:34 PM Ivette P wrote: Reason for CRM: Pt wife Katheryn called in to see about getting a work note, Disability ends on Sunday. He can't go to work until gets that letter to clear him to go back to work.    Katheryn is concerned because they will not have any income coming in.   Requesting a call back from Dyane Powell BIRCH, CMA   Lori callback [phone]

## 2023-07-18 NOTE — Telephone Encounter (Signed)
 Spoke with Colin Ellis let him know a letter was sent out on 07/16/23 , verbalized understanding. If a hard copy is needed he can come by the office to pick it up. Pt verbalized understanding

## 2023-07-18 NOTE — Telephone Encounter (Unsigned)
 Copied from CRM [id number]. Topic: General - Call Back - No Documentation >> Jul 18, 2023 11:06 AM Tiffany S wrote: Reason for CRM: Patient wife was returning call please follow up with patient

## 2023-07-20 ENCOUNTER — Encounter: Payer: Self-pay | Admitting: Internal Medicine

## 2023-07-22 ENCOUNTER — Encounter: Payer: Self-pay | Admitting: Internal Medicine

## 2023-07-23 ENCOUNTER — Ambulatory Visit (INDEPENDENT_AMBULATORY_CARE_PROVIDER_SITE_OTHER): Admitting: Internal Medicine

## 2023-07-23 ENCOUNTER — Ambulatory Visit: Payer: Self-pay

## 2023-07-23 ENCOUNTER — Encounter: Payer: Self-pay | Admitting: Internal Medicine

## 2023-07-23 VITALS — BP 109/65 | HR 84 | Ht 71.0 in | Wt 159.8 lb

## 2023-07-23 DIAGNOSIS — Z981 Arthrodesis status: Secondary | ICD-10-CM | POA: Diagnosis not present

## 2023-07-23 DIAGNOSIS — M51362 Other intervertebral disc degeneration, lumbar region with discogenic back pain and lower extremity pain: Secondary | ICD-10-CM

## 2023-07-23 DIAGNOSIS — M160 Bilateral primary osteoarthritis of hip: Secondary | ICD-10-CM | POA: Insufficient documentation

## 2023-07-23 DIAGNOSIS — K219 Gastro-esophageal reflux disease without esophagitis: Secondary | ICD-10-CM

## 2023-07-23 DIAGNOSIS — M5030 Other cervical disc degeneration, unspecified cervical region: Secondary | ICD-10-CM

## 2023-07-23 NOTE — Assessment & Plan Note (Signed)
 X-ray of pelvis reviewed, shows degenerative changes bilaterally Meloxicam  as needed for pain Referred to orthopedic surgeon

## 2023-07-23 NOTE — Assessment & Plan Note (Signed)
 Has history of lumbar spinal fusion Had sacral fracture in 2023, had worsening of back pain 2 weeks ago Went to ER - was given Prednisone  dose pak by Orthopedic surgeon On Gabapentin  300 mg TID Valium  as muscle relaxer Needs to be seen by Spine surgeon - referred to [facility] [place] Referred to PT Advised to avoid heavy lifting and frequent bending - work excuse note provided, FMLA extended for additional 1 week

## 2023-07-23 NOTE — Assessment & Plan Note (Signed)
 Has longstanding history of alcohol abuse and NSAID use Currently has to take meloxicam  for acute low back pain and left hip pain Continue omeprazole  40 mg QD for GERD Avoid hot and spicy food

## 2023-07-23 NOTE — Assessment & Plan Note (Signed)
 Tingling around neck and upper left arm could be due to cervical spondylosis Has h/o alcohol abuse - peripheral neuropathy can be a factor as well Advised to take Vitamin B12 500 mcg QD Advised to continue gabapentin  300 mg TID Baclofen as needed for neck muscle spasms

## 2023-07-23 NOTE — Progress Notes (Signed)
 Acute Office Visit  Subjective:    Patient ID: Colin Ellis, male    DOB: 1961/01/17, 63 y.o.   MRN: [id number]  Chief Complaint  Patient presents with   Back Pain    requesting a letter to extend his disability another week, until Mon., July 7.      HPI Patient is in today for follow-up of acute on chronic low back pain since 06/03/23.  His pain is constant, sharp, radiating to bilateral LE and worse with bending, standing and walking.  He has tried taking meloxicam  with some relief. He was given prednisone , baclofen  and gabapentin  by orthopedic surgeon, and has noticed some improvement in back pain since the last visit.  He still has not been able to return to work due to severe low back pain.  Of note, he lifts and carries heavy weights at his workplace.  Denies any recent fall or injury.  Denies saddle anesthesia, urinary or stool incontinence.  He was referred to [place] [facility], but has not been able to schedule appointment yet due to financial constraints. He was later referred to [facility], and is awaiting evaluation. He is planned to start physical therapy in the next week.  He also has chronic neck pain, radiating to left shoulder area.  Pain is constant, sharp, and worse with neck movement.  He was evaluated by orthopedic surgeon, had CT of chest done to rule out clavicular fracture, which was negative for clavicular fracture, but did show DDD of cervical spine.  He had a fall on 06/27/23 at home from the stairs.  He fell on his left side, hitting the left rib cage and hip area.  He had bruising over the left lower ribs area, which is slightly better today compared to the day of the fall.  X-ray of ribs was negative for any acute fracture.  Past Medical History:  Diagnosis Date   ADH disorder    Arthritis    Electrical injury in adult    Electrician by trade, several shocks during working career    Psoriasis    Teeth missing due to trauma     Past  Surgical History:  Procedure Laterality Date   BACK SURGERY     herniated disc   COLONOSCOPY  11/12/2010   Surgeon: Lamar CHRISTELLA Hollingshead, MD;  pancolonic diverticulosis, otherwise normal exam.   COLONOSCOPY WITH PROPOFOL  N/A 07/09/2021   Procedure: COLONOSCOPY WITH PROPOFOL ;  Surgeon: Hollingshead Lamar CHRISTELLA, MD;  Location: [facility];  Service: Endoscopy;  Laterality: N/A;  12:45pm    Family History  Problem Relation Age of Onset   Colon cancer Paternal Grandfather        78s or 38s   Anesthesia problems Neg Hx    Hypotension Neg Hx    Malignant hyperthermia Neg Hx    Pseudochol deficiency Neg Hx     Social History   Socioeconomic History   Marital status: Married    Spouse name: Not on file   Number of children: Not on file   Years of education: Not on file   Highest education level: GED or equivalent  Occupational History   Not on file  Tobacco Use   Smoking status: Every Day    Current packs/day: 1.00    Average packs/day: 1 pack/day for 40.0 years (40.0 ttl pk-yrs)    Types: Cigarettes   Smokeless tobacco: Never   Tobacco comments:    13 cigarettes a day as of 06/30/23  Vaping Use  Vaping status: Never Used  Substance and Sexual Activity   Alcohol use: Yes    Alcohol/week: 4.0 standard drinks of alcohol    Types: 4 Cans of beer per week    Comment: 3-4 beers a night   Drug use: No   Sexual activity: Not on file  Other Topics Concern   Not on file  Social History Narrative   Not on file   Social Drivers of Health   Financial Resource Strain: Medium Risk (07/22/2023)   Overall Financial Resource Strain (CARDIA)    Difficulty of Paying Living Expenses: Somewhat hard  Food Insecurity: No Food Insecurity (07/22/2023)   Hunger Vital Sign    Worried About Running Out of Food in the Last Year: Never true    Ran Out of Food in the Last Year: Never true  Recent Concern: Food Insecurity - Food Insecurity Present (06/27/2023)   Hunger Vital Sign    Worried About Running Out of  Food in the Last Year: Sometimes true    Ran Out of Food in the Last Year: Sometimes true  Transportation Needs: Unmet Transportation Needs (07/22/2023)   PRAPARE - Administrator, Civil Service (Medical): Yes    Lack of Transportation (Non-Medical): No  Physical Activity: Inactive (07/22/2023)   Exercise Vital Sign    Days of Exercise per Week: 0 days    Minutes of Exercise per Session: Not on file  Stress: Stress Concern Present (07/22/2023)   [employer] of Occupational Health - Occupational Stress Questionnaire    Feeling of Stress: To some extent  Social Connections: Unknown (07/22/2023)   Social Connection and Isolation Panel    Frequency of Communication with Friends and Family: Once a week    Frequency of Social Gatherings with Friends and Family: Patient declined    Attends Religious Services: Never    Database administrator or Organizations: No    Attends Engineer, structural: Not on file    Marital Status: Married  Catering manager Violence: Not At Risk (06/27/2023)   Humiliation, Afraid, Rape, and Kick questionnaire    Fear of Current or Ex-Partner: No    Emotionally Abused: No    Physically Abused: No    Sexually Abused: No    Outpatient Medications Prior to Visit  Medication Sig Dispense Refill   albuterol  (VENTOLIN  HFA) 108 (90 Base) MCG/ACT inhaler Inhale 2 puffs into the lungs every 6 (six) hours as needed for wheezing or shortness of breath. 8 g 2   baclofen  (LIORESAL ) 10 MG tablet Take 1 tablet (10 mg total) by mouth 2 (two) times daily. 60 each 2   clobetasol  ointment (TEMOVATE ) 0.05 % Apply 1 Application topically 2 (two) times daily. 60 g 1   cyanocobalamin (VITAMIN B12) 1000 MCG tablet Take 1,000 mcg by mouth daily.     gabapentin  (NEURONTIN ) 300 MG capsule Take 1 capsule (300 mg total) by mouth 3 (three) times daily. 90 capsule 5   losartan  (COZAAR ) 100 MG tablet Take 1 tablet (100 mg total) by mouth daily. 90 tablet 1   Melatonin 5 MG CAPS  Take 3 each by mouth at bedtime as needed.     meloxicam  (MOBIC ) 15 MG tablet Take 1 tablet (15 mg total) by mouth daily. 30 tablet 3   Multiple Vitamin (MULTIVITAMIN) tablet Take 1 tablet by mouth daily.     omeprazole  (PRILOSEC) 40 MG capsule Take 1 capsule (40 mg total) by mouth daily. 30 capsule 3   diazepam  (  VALIUM ) 5 MG tablet Take 1 tablet (5 mg total) by mouth every 6 (six) hours as needed for anxiety. 20 tablet 0   No facility-administered medications prior to visit.    Allergies  Allergen Reactions   Sulfa Antibiotics Nausea And Vomiting    Upset stomach    Review of Systems  Constitutional:  Positive for fatigue. Negative for chills and fever.  HENT:  Negative for congestion, sinus pressure and sinus pain.   Eyes:  Negative for pain and discharge.  Respiratory:  Negative for cough and shortness of breath.   Cardiovascular:  Negative for chest pain and palpitations.  Gastrointestinal:  Negative for diarrhea, nausea and vomiting.  Genitourinary:  Negative for dysuria and hematuria.  Musculoskeletal:  Positive for arthralgias (Left clavicular and shoulder pain), back pain, gait problem and neck pain. Negative for neck stiffness.  Skin:  Positive for rash.  Neurological:  Negative for dizziness and weakness.  Psychiatric/Behavioral:  Positive for decreased concentration. Negative for agitation and confusion. The patient is nervous/anxious.        Objective:     Physical Exam Constitutional:      General: He is not in acute distress.    Appearance: He is not diaphoretic.  HENT:     Head: Normocephalic.     Nose: No congestion.     Mouth/Throat:     Mouth: Mucous membranes are moist.     Pharynx: No posterior oropharyngeal erythema.  Eyes:     General: No scleral icterus.    Extraocular Movements: Extraocular movements intact.  Cardiovascular:     Rate and Rhythm: Normal rate and regular rhythm.     Heart sounds: Normal heart sounds. No murmur heard. Pulmonary:      Breath sounds: Normal breath sounds. No wheezing or rales.  Musculoskeletal:     Left shoulder: No swelling. Normal range of motion.     Cervical back: Neck supple. Tenderness present.     Lumbar back: Tenderness present. Decreased range of motion. Positive right straight leg raise test and positive left straight leg raise test.     Right lower leg: No edema.     Left lower leg: No edema.     Comments: Left mid clavicular area tenderness  Skin:    General: Skin is warm.     Findings: Rash (Erythematous patches with silvery scaling over bilateral shin) present.  Neurological:     General: No focal deficit present.     Mental Status: He is alert and oriented to person, place, and time.     Sensory: No sensory deficit.     Motor: No weakness.  Psychiatric:        Mood and Affect: Mood is anxious.        Behavior: Behavior is cooperative.     BP 109/65   Pulse 84   Ht 5' 11 (1.803 m)   Wt 159 lb 12.8 oz (72.5 kg)   SpO2 97%   BMI 22.29 kg/m  Wt Readings from Last 3 Encounters:  07/23/23 159 lb 12.8 oz (72.5 kg)  06/30/23 167 lb 9.6 oz (76 kg)  06/17/23 159 lb 12.8 oz (72.5 kg)        Assessment & Plan:   Problem List Items Addressed This Visit       Digestive   Gastroesophageal reflux disease   Has longstanding history of alcohol abuse and NSAID use Currently has to take meloxicam  for acute low back pain and left hip pain Continue omeprazole   40 mg QD for GERD Avoid hot and spicy food        Musculoskeletal and Integument   DDD (degenerative disc disease), cervical   Tingling around neck and upper left arm could be due to cervical spondylosis Has h/o alcohol abuse - peripheral neuropathy can be a factor as well Advised to take Vitamin B12 500 mcg QD Advised to continue gabapentin  300 mg TID Baclofen  as needed for neck muscle spasms      Degeneration of intervertebral disc of lumbar region with discogenic back pain and lower extremity pain - Primary    Acute on chronic low back pain, likely due to heavy lifting at his workplace He has a history of sacral fracture and lumbar fusion surgery Continue gabapentin  300 mg 3 times daily Baclofen  as needed for muscle spasms Completed Tramadol  as needed for severe pain, has quit alcohol use now Needs spine surgery evaluation - ordered CT of lumbar spine, avoided MRI of lumbar spine as he has hardware in place Needs to start physical therapy (referral was placed in the last visit) Simple back exercises material provided  Due to inadequate recovery, extended work note for 1 more week till 07/28/23 - return to work with restrictions for 2 months (avoid prolonged standing and lifting more than 15 lbs)      Relevant Orders   CT Lumbar Spine Wo Contrast   Primary osteoarthritis of both hips   X-ray of pelvis reviewed, shows degenerative changes bilaterally Meloxicam  as needed for pain Referred to orthopedic surgeon        Other   S/P lumbar spinal fusion   Has history of lumbar spinal fusion Had sacral fracture in 2023, had worsening of back pain 2 weeks ago Went to ER - was given Prednisone  dose pak by Orthopedic surgeon On Gabapentin  300 mg TID Valium  as muscle relaxer Needs to be seen by Spine surgeon - referred to [facility] Referred to PT Advised to avoid heavy lifting and frequent bending - work excuse note provided, FMLA extended for additional 1 week          No orders of the defined types were placed in this encounter.    Suzzane MARLA Blanch, MD

## 2023-07-23 NOTE — Assessment & Plan Note (Addendum)
 Acute on chronic low back pain, likely due to heavy lifting at his workplace He has a history of sacral fracture and lumbar fusion surgery Continue gabapentin  300 mg 3 times daily Baclofen  as needed for muscle spasms Completed Tramadol  as needed for severe pain, has quit alcohol use now Needs spine surgery evaluation - ordered CT of lumbar spine, avoided MRI of lumbar spine as he has hardware in place Needs to start physical therapy (referral was placed in the last visit) Simple back exercises material provided  Due to inadequate recovery, extended work note for 1 more week till 07/28/23 - return to work with restrictions for 2 months (avoid prolonged standing and lifting more than 15 lbs)

## 2023-07-23 NOTE — Patient Instructions (Signed)
 Please continue taking Gabapentin  and Meloxicam  as prescribed.  Please take Baclofen  as needed for muscle spasms.  Please perform simple back exercises.

## 2023-07-28 ENCOUNTER — Encounter: Payer: Self-pay | Admitting: Internal Medicine

## 2023-07-29 ENCOUNTER — Encounter: Payer: Self-pay | Admitting: Internal Medicine

## 2023-07-30 ENCOUNTER — Ambulatory Visit (HOSPITAL_COMMUNITY)

## 2023-07-30 NOTE — Telephone Encounter (Unsigned)
 Copied from CRM [id number]. Topic: General - Other >> Jul 30, 2023  1:29 PM Edsel HERO wrote: Patient's wife called to see if patient's short term disability could be extended. She stated that she sent the paperwork yesterday via myChart.

## 2023-08-04 ENCOUNTER — Encounter: Payer: Self-pay | Admitting: Internal Medicine

## 2023-08-05 ENCOUNTER — Encounter (HOSPITAL_COMMUNITY): Payer: Self-pay

## 2023-08-05 ENCOUNTER — Ambulatory Visit (HOSPITAL_COMMUNITY)

## 2023-08-05 ENCOUNTER — Ambulatory Visit (HOSPITAL_COMMUNITY): Attending: Internal Medicine

## 2023-08-06 ENCOUNTER — Encounter: Payer: Self-pay | Admitting: Internal Medicine

## 2023-08-08 ENCOUNTER — Ambulatory Visit (INDEPENDENT_AMBULATORY_CARE_PROVIDER_SITE_OTHER): Payer: Commercial Managed Care - PPO | Admitting: Internal Medicine

## 2023-08-08 ENCOUNTER — Encounter: Payer: Self-pay | Admitting: Internal Medicine

## 2023-08-08 VITALS — BP 132/72 | HR 80 | Ht 71.0 in | Wt 159.6 lb

## 2023-08-08 DIAGNOSIS — F1029 Alcohol dependence with unspecified alcohol-induced disorder: Secondary | ICD-10-CM | POA: Diagnosis not present

## 2023-08-08 DIAGNOSIS — K852 Alcohol induced acute pancreatitis without necrosis or infection: Secondary | ICD-10-CM

## 2023-08-08 DIAGNOSIS — R1013 Epigastric pain: Secondary | ICD-10-CM | POA: Diagnosis not present

## 2023-08-08 DIAGNOSIS — K219 Gastro-esophageal reflux disease without esophagitis: Secondary | ICD-10-CM | POA: Diagnosis not present

## 2023-08-08 DIAGNOSIS — M51362 Other intervertebral disc degeneration, lumbar region with discogenic back pain and lower extremity pain: Secondary | ICD-10-CM | POA: Diagnosis not present

## 2023-08-08 MED ORDER — OMEPRAZOLE 40 MG PO CPDR: 40.0000 mg | DELAYED_RELEASE_CAPSULE | Freq: Two times a day (BID) | ORAL | 3 refills | Status: DC

## 2023-08-08 NOTE — Assessment & Plan Note (Addendum)
 As his pain is radiating towards back, considering his history of alcohol abuse, will check lipase level Advised to avoid alcohol intake Increase water  intake to at least 64 ounces in a day

## 2023-08-08 NOTE — Assessment & Plan Note (Signed)
 Takes at least 2 beers every day, likely a factor for his elevated BP and MDD Advised to cut down and later avoid drinking

## 2023-08-08 NOTE — Patient Instructions (Signed)
 Please start taking Omeprazole  40 mg twice daily.  Please avoid alcohol use. Please maintain at least 64 ounces of water  intake in a day.

## 2023-08-08 NOTE — Assessment & Plan Note (Signed)
 Acute on chronic low back pain, likely due to heavy lifting at his workplace He has a history of sacral fracture and lumbar fusion surgery Continue gabapentin  300 mg 3 times daily Baclofen  as needed for muscle spasms Completed Tramadol  as needed for severe pain, would avoid controlled medicines due to his alcohol use again Needs spine surgery evaluation - ordered CT of lumbar spine, avoided MRI of lumbar spine as he has hardware in place Needs to start physical therapy (referral was placed in the last visit) Simple back exercises material provided  Due to inadequate recovery, extended work note for 1 more week till 08/14/23 - return to work with restrictions for 2 months (avoid prolonged standing and lifting more than 15 lbs)

## 2023-08-08 NOTE — Assessment & Plan Note (Signed)
 Has longstanding history of alcohol abuse and NSAID use Currently has to take meloxicam  for acute low back pain and left hip pain Increased dose of omeprazole  40 mg to BID Avoid hot and spicy food

## 2023-08-08 NOTE — Progress Notes (Addendum)
 Acute Office Visit  Subjective:    Patient ID: Colin Ellis, male    DOB: 1961-01-18, 63 y.o.   MRN: [id number]  Chief Complaint  Patient presents with   Follow-up    Pt f/u , reports abdominal pain in his upper area, gets bad that it goes to his back.     HPI Patient is in today for worsening of epigastric pain for the last 2 weeks.  His pain is constant, sharp, radiating towards his back and worse in the morning.  Denies any nausea or vomiting. He has been taking omeprazole  40 mg QD.  Of note, he has not had to take meloxicam  recently due to worsening of his back pain.  He also takes 2 beers every day, which he actually has cut down from prior.  He agrees to avoid it and increase water  intake.  He has had acute on chronic low back pain since 06/03/23.  His pain is constant, sharp, radiating to bilateral LE and worse with bending, standing and walking.  He has tried taking meloxicam  with some relief. He was given prednisone , baclofen  and gabapentin  by orthopedic surgeon, and has noticed some improvement in back pain, but has had worsening of symptoms in the morning.  He still has not been able to return to work due to severe low back pain.  Of note, he lifts and carries heavy weights at his workplace.  Denies any recent fall or injury.  Denies saddle anesthesia, urinary or stool incontinence.  He was referred to [place] [facility], but has not been able to schedule appointment yet due to financial constraints. He was later referred to [facility], and is awaiting evaluation. He could not start physical therapy due to worsening of pain.  He also has chronic neck pain, radiating to left shoulder area.  Pain is constant, sharp, and worse with neck movement.  He was evaluated by orthopedic surgeon, had CT of chest done to rule out clavicular fracture, which was negative for clavicular fracture, but did show DDD of cervical spine.  He had a fall on 06/27/23 at home from the stairs.   He fell on his left side, hitting the left rib cage and hip area.  He had bruising over the left lower ribs area, which is slightly better today compared to the day of the fall.  X-ray of ribs was negative for any acute fracture.  Past Medical History:  Diagnosis Date   ADH disorder    Arthritis    Electrical injury in adult    Electrician by trade, several shocks during working career    Psoriasis    Teeth missing due to trauma     Past Surgical History:  Procedure Laterality Date   BACK SURGERY     herniated disc   COLONOSCOPY  11/12/2010   Surgeon: Lamar CHRISTELLA Hollingshead, MD;  pancolonic diverticulosis, otherwise normal exam.   COLONOSCOPY WITH PROPOFOL  N/A 07/09/2021   Procedure: COLONOSCOPY WITH PROPOFOL ;  Surgeon: Hollingshead Lamar CHRISTELLA, MD;  [facility];  Service: Endoscopy;  Laterality: N/A;  12:45pm    Family History  Problem Relation Age of Onset   Colon cancer Paternal Grandfather        10s or 90s   Anesthesia problems Neg Hx    Hypotension Neg Hx    Malignant hyperthermia Neg Hx    Pseudochol deficiency Neg Hx     Social History   Socioeconomic History   Marital status: Married  Spouse name: Not on file   Number of children: Not on file   Years of education: Not on file   Highest education level: GED or equivalent  Occupational History   Not on file  Tobacco Use   Smoking status: Every Day    Current packs/day: 1.00    Average packs/day: 1 pack/day for 40.0 years (40.0 ttl pk-yrs)    Types: Cigarettes   Smokeless tobacco: Never   Tobacco comments:    13 cigarettes a day as of 06/30/23  Vaping Use   Vaping status: Never Used  Substance and Sexual Activity   Alcohol use: Yes    Alcohol/week: 4.0 standard drinks of alcohol    Types: 4 Cans of beer per week    Comment: 3-4 beers a night   Drug use: No   Sexual activity: Not on file  Other Topics Concern   Not on file  Social History Narrative   Not on file   Social Drivers of Health    Financial Resource Strain: Medium Risk (07/22/2023)   Overall Financial Resource Strain (CARDIA)    Difficulty of Paying Living Expenses: Somewhat hard  Food Insecurity: No Food Insecurity (07/22/2023)   Hunger Vital Sign    Worried About Running Out of Food in the Last Year: Never true    Ran Out of Food in the Last Year: Never true  Recent Concern: Food Insecurity - Food Insecurity Present (06/27/2023)   Hunger Vital Sign    Worried About Running Out of Food in the Last Year: Sometimes true    Ran Out of Food in the Last Year: Sometimes true  Transportation Needs: Unmet Transportation Needs (07/22/2023)   PRAPARE - Administrator, Civil Service (Medical): Yes    Lack of Transportation (Non-Medical): No  Physical Activity: Inactive (07/22/2023)   Exercise Vital Sign    Days of Exercise per Week: 0 days    Minutes of Exercise per Session: Not on file  Stress: Stress Concern Present (07/22/2023)   [employer] of Occupational Health - Occupational Stress Questionnaire    Feeling of Stress: To some extent  Social Connections: Unknown (07/22/2023)   Social Connection and Isolation Panel    Frequency of Communication with Friends and Family: Once a week    Frequency of Social Gatherings with Friends and Family: Patient declined    Attends Religious Services: Never    Database administrator or Organizations: No    Attends Engineer, structural: Not on file    Marital Status: Married  Catering manager Violence: Not At Risk (06/27/2023)   Humiliation, Afraid, Rape, and Kick questionnaire    Fear of Current or Ex-Partner: No    Emotionally Abused: No    Physically Abused: No    Sexually Abused: No    Outpatient Medications Prior to Visit  Medication Sig Dispense Refill   albuterol  (VENTOLIN  HFA) 108 (90 Base) MCG/ACT inhaler Inhale 2 puffs into the lungs every 6 (six) hours as needed for wheezing or shortness of breath. 8 g 2   baclofen  (LIORESAL ) 10 MG tablet Take 1  tablet (10 mg total) by mouth 2 (two) times daily. 60 each 2   clobetasol  ointment (TEMOVATE ) 0.05 % Apply 1 Application topically 2 (two) times daily. 60 g 1   cyanocobalamin (VITAMIN B12) 1000 MCG tablet Take 1,000 mcg by mouth daily.     diazepam  (VALIUM ) 5 MG tablet Take 1 tablet (5 mg total) by mouth every 6 (six) hours  as needed for anxiety. 20 tablet 0   gabapentin  (NEURONTIN ) 300 MG capsule Take 1 capsule (300 mg total) by mouth 3 (three) times daily. 90 capsule 5   losartan  (COZAAR ) 100 MG tablet Take 1 tablet (100 mg total) by mouth daily. 90 tablet 1   Melatonin 5 MG CAPS Take 3 each by mouth at bedtime as needed.     meloxicam  (MOBIC ) 15 MG tablet Take 1 tablet (15 mg total) by mouth daily. 30 tablet 3   Multiple Vitamin (MULTIVITAMIN) tablet Take 1 tablet by mouth daily.     omeprazole  (PRILOSEC) 40 MG capsule Take 1 capsule (40 mg total) by mouth daily. 30 capsule 3   No facility-administered medications prior to visit.    Allergies  Allergen Reactions   Sulfa Antibiotics Nausea And Vomiting    Upset stomach    Review of Systems  Constitutional:  Positive for fatigue. Negative for chills and fever.  HENT:  Negative for congestion, sinus pressure and sinus pain.   Eyes:  Negative for pain and discharge.  Respiratory:  Negative for cough and shortness of breath.   Cardiovascular:  Negative for chest pain and palpitations.  Gastrointestinal:  Positive for abdominal pain. Negative for diarrhea, nausea and vomiting.  Genitourinary:  Negative for dysuria and hematuria.  Musculoskeletal:  Positive for arthralgias (Left clavicular and shoulder pain), back pain, gait problem and neck pain. Negative for neck stiffness.  Skin:  Positive for rash.  Neurological:  Negative for dizziness and weakness.  Psychiatric/Behavioral:  Positive for decreased concentration. Negative for agitation and confusion. The patient is nervous/anxious.        Objective:     Physical  Exam Constitutional:      General: He is not in acute distress.    Appearance: He is not diaphoretic.  HENT:     Head: Normocephalic.     Nose: No congestion.     Mouth/Throat:     Mouth: Mucous membranes are moist.     Pharynx: No posterior oropharyngeal erythema.  Eyes:     General: No scleral icterus.    Extraocular Movements: Extraocular movements intact.  Cardiovascular:     Rate and Rhythm: Normal rate and regular rhythm.     Heart sounds: Normal heart sounds. No murmur heard. Pulmonary:     Breath sounds: Normal breath sounds. No wheezing or rales.  Abdominal:     Palpations: Abdomen is soft.     Tenderness: There is abdominal tenderness (Epigastric).  Musculoskeletal:     Left shoulder: No swelling. Normal range of motion.     Cervical back: Neck supple. Tenderness present.     Lumbar back: Tenderness present. Decreased range of motion. Positive right straight leg raise test and positive left straight leg raise test.     Right lower leg: No edema.     Left lower leg: No edema.     Comments: Left mid clavicular area tenderness  Skin:    General: Skin is warm.     Findings: Rash (Erythematous patches with silvery scaling over bilateral shin) present.  Neurological:     General: No focal deficit present.     Mental Status: He is alert and oriented to person, place, and time.     Sensory: No sensory deficit.     Motor: No weakness.  Psychiatric:        Mood and Affect: Mood is anxious.        Behavior: Behavior is cooperative.     BP  132/72 (BP Location: Left Arm)   Pulse 80   Ht 5' 11 (1.803 m)   Wt 159 lb 9.6 oz (72.4 kg)   SpO2 94%   BMI 22.26 kg/m  Wt Readings from Last 3 Encounters:  08/08/23 159 lb 9.6 oz (72.4 kg)  07/23/23 159 lb 12.8 oz (72.5 kg)  06/30/23 167 lb 9.6 oz (76 kg)        Assessment & Plan:   Problem List Items Addressed This Visit       Digestive   Gastroesophageal reflux disease - Primary   Has longstanding history of  alcohol abuse and NSAID use Currently has to take meloxicam  for acute low back pain and left hip pain Increased dose of omeprazole  40 mg to BID Avoid hot and spicy food      Relevant Medications   omeprazole  (PRILOSEC) 40 MG capsule   Other Relevant Orders   CMP14+EGFR (Completed)   Lipase (Completed)   Alcohol-induced acute pancreatitis   Check lipase, markedly elevated He has abdominal pain, but is able to tolerate p.o. intake currently Denies nausea or vomiting currently Advised to increase fluid intake and strictly avoid alcohol intake      Relevant Medications   omeprazole  (PRILOSEC) 40 MG capsule     Musculoskeletal and Integument   Degeneration of intervertebral disc of lumbar region with discogenic back pain and lower extremity pain   Acute on chronic low back pain, likely due to heavy lifting at his workplace He has a history of sacral fracture and lumbar fusion surgery Continue gabapentin  300 mg 3 times daily Baclofen  as needed for muscle spasms Completed Tramadol  as needed for severe pain, would avoid controlled medicines due to his alcohol use again Needs spine surgery evaluation - ordered CT of lumbar spine, avoided MRI of lumbar spine as he has hardware in place Needs to start physical therapy (referral was placed in the last visit) Simple back exercises material provided  Due to inadequate recovery, extended work note for 1 more week till 08/14/23 - return to work with restrictions for 2 months (avoid prolonged standing and lifting more than 15 lbs)        Other   Alcohol dependence with unspecified alcohol-induced disorder (HCC)   Takes at least 2 beers every day, likely a factor for his elevated BP and MDD Advised to cut down and later avoid drinking      Relevant Orders   Lipase (Completed)   Gamma GT (Completed)   Epigastric pain   As his pain is radiating towards back, considering his history of alcohol abuse, will check lipase level Advised to avoid  alcohol intake Increase water  intake to at least 64 ounces in a day      Relevant Orders   CMP14+EGFR (Completed)   Lipase (Completed)   Gamma GT (Completed)        Meds ordered this encounter  Medications   omeprazole  (PRILOSEC) 40 MG capsule    Sig: Take 1 capsule (40 mg total) by mouth 2 (two) times daily.    Dispense:  60 capsule    Refill:  3     Corey Caulfield MARLA Blanch, MD

## 2023-08-09 LAB — CMP14+EGFR
ALT: 36 IU/L (ref 0–44)
AST: 32 IU/L (ref 0–40)
Albumin: 4.1 g/dL (ref 3.9–4.9)
Alkaline Phosphatase: 80 IU/L (ref 44–121)
BUN/Creatinine Ratio: 6 — ABNORMAL LOW (ref 10–24)
BUN: 5 mg/dL — ABNORMAL LOW (ref 8–27)
Bilirubin Total: 0.4 mg/dL (ref 0.0–1.2)
CO2: 21 mmol/L (ref 20–29)
Calcium: 9.3 mg/dL (ref 8.6–10.2)
Chloride: 102 mmol/L (ref 96–106)
Creatinine, Ser: 0.77 mg/dL (ref 0.76–1.27)
Globulin, Total: 2.3 g/dL (ref 1.5–4.5)
Glucose: 85 mg/dL (ref 70–99)
Potassium: 4.8 mmol/L (ref 3.5–5.2)
Sodium: 138 mmol/L (ref 134–144)
Total Protein: 6.4 g/dL (ref 6.0–8.5)
eGFR: 101 mL/min/1.73 (ref >59)

## 2023-08-09 LAB — GAMMA GT: GGT: 61 IU/L (ref 0–65)

## 2023-08-09 LAB — LIPASE: Lipase: 296 U/L — ABNORMAL HIGH (ref 13–78)

## 2023-08-11 ENCOUNTER — Ambulatory Visit: Payer: Self-pay | Admitting: Internal Medicine

## 2023-08-11 ENCOUNTER — Encounter: Payer: Self-pay | Admitting: Internal Medicine

## 2023-08-12 ENCOUNTER — Other Ambulatory Visit: Payer: Self-pay | Admitting: Internal Medicine

## 2023-08-12 ENCOUNTER — Encounter: Payer: Self-pay | Admitting: Internal Medicine

## 2023-08-12 DIAGNOSIS — K859 Acute pancreatitis without necrosis or infection, unspecified: Secondary | ICD-10-CM

## 2023-08-12 DIAGNOSIS — K852 Alcohol induced acute pancreatitis without necrosis or infection: Secondary | ICD-10-CM | POA: Insufficient documentation

## 2023-08-12 DIAGNOSIS — K219 Gastro-esophageal reflux disease without esophagitis: Secondary | ICD-10-CM

## 2023-08-12 NOTE — Assessment & Plan Note (Signed)
 Check lipase, markedly elevated He has abdominal pain, but is able to tolerate p.o. intake currently Denies nausea or vomiting currently Advised to increase fluid intake and strictly avoid alcohol intake

## 2023-08-14 ENCOUNTER — Ambulatory Visit: Admitting: Physical Medicine and Rehabilitation

## 2023-08-14 ENCOUNTER — Encounter: Payer: Self-pay | Admitting: Physical Medicine and Rehabilitation

## 2023-08-14 DIAGNOSIS — M961 Postlaminectomy syndrome, not elsewhere classified: Secondary | ICD-10-CM | POA: Diagnosis not present

## 2023-08-14 DIAGNOSIS — G8929 Other chronic pain: Secondary | ICD-10-CM

## 2023-08-14 DIAGNOSIS — M5442 Lumbago with sciatica, left side: Secondary | ICD-10-CM

## 2023-08-14 DIAGNOSIS — M5416 Radiculopathy, lumbar region: Secondary | ICD-10-CM | POA: Diagnosis not present
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
## 2023-08-14 NOTE — Progress Notes (Signed)
 Pain Scale   Average Pain 8 Patient advising he has lower back pain radiating to his left side and pain increases when walking and standing. Patient advises his pain decreases when lying down.        +Driver, -BT, -Dye Allergies.

## 2023-08-17 ENCOUNTER — Ambulatory Visit (HOSPITAL_COMMUNITY)

## 2023-08-19 ENCOUNTER — Encounter: Payer: Self-pay | Admitting: Gastroenterology

## 2023-08-21 ENCOUNTER — Ambulatory Visit (HOSPITAL_COMMUNITY)

## 2023-08-23 ENCOUNTER — Ambulatory Visit (HOSPITAL_COMMUNITY)
Admission: RE | Admit: 2023-08-23 | Discharge: 2023-08-23 | Disposition: A | Discharge status: Home / Self Care | Source: Ambulatory Visit | Attending: Physical Medicine and Rehabilitation | Admitting: Physical Medicine and Rehabilitation

## 2023-08-23 DIAGNOSIS — G8929 Other chronic pain: Secondary | ICD-10-CM | POA: Insufficient documentation

## 2023-08-23 DIAGNOSIS — M5416 Radiculopathy, lumbar region: Secondary | ICD-10-CM | POA: Diagnosis present

## 2023-08-23 DIAGNOSIS — M961 Postlaminectomy syndrome, not elsewhere classified: Secondary | ICD-10-CM | POA: Diagnosis present

## 2023-08-23 DIAGNOSIS — M5442 Lumbago with sciatica, left side: Secondary | ICD-10-CM | POA: Insufficient documentation

## 2023-08-31 ENCOUNTER — Encounter: Payer: Self-pay | Admitting: Internal Medicine

## 2023-09-01 ENCOUNTER — Ambulatory Visit: Payer: Self-pay

## 2023-09-01 ENCOUNTER — Telehealth: Payer: Self-pay | Admitting: Internal Medicine

## 2023-09-01 NOTE — Telephone Encounter (Signed)
 FMLA forms  Noted Copied Sleeved Original placed in provider box Copy placed front desk folder

## 2023-09-03 ENCOUNTER — Encounter: Payer: Self-pay | Admitting: Gastroenterology

## 2023-09-03 NOTE — Progress Notes (Deleted)
 Referring Provider: [name], MD Primary Care Physician:  [name], MD Primary GI Physician: Dr. PIERRETTE Johns chief complaint on file.   HPI:   Colin Ellis is a 63 y.o. male with history of ADHD, psoriasis, presenting today at the request of Dr. Tobie for GERD, acute recurrent pancreatitis.  Reviewed office visit with Dr. Tobie dated 08/08/2023.  Patient reporting worsening epigastric pain x 2 weeks with pain radiating towards his back.  He was drinking 2 beer per day which was an improvement.  He was also dealing with ongoing low back pain since May.  He had been referred to Ortho care in [place] and was awaiting evaluation.  Also dealing with chronic neck pain.  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  Omeprazole  was increased to 40 mg twice daily.  Labs ordered including CMP and lipase and was found to have normal LFTs, but elevated lipase of 296.  He was advised to strictly avoid alcohol and increase water  intake.  For his back pain, he was continued on gabapentin , baclofen . CT lumbar spine ordered.   Ended up having MRI of the lumbar spine which showed lumbar spondylosis, laminectomy at L1-2 and PLIF with posterolateral fusion at L2-3.  Moderate spinal stenosis at L3-4 and moderate to severe spinal stenosis at L4-5.     Today:    Colonoscopy 07/09/2021 with diverticulosis in the entire colon, redundant colon, otherwise normal exam.  Recommended 10-year screening colonoscopy.  Past Medical History:  Diagnosis Date   ADH disorder    Arthritis    Electrical injury in adult    Electrician by trade, several shocks during working career    Psoriasis    Teeth missing due to trauma     Past Surgical History:  Procedure Laterality Date   BACK SURGERY     herniated disc   COLONOSCOPY  11/12/2010   Surgeon: Lamar CHRISTELLA Hollingshead, MD;  pancolonic diverticulosis, otherwise normal exam.   COLONOSCOPY WITH PROPOFOL  N/A 07/09/2021    Procedure: COLONOSCOPY WITH PROPOFOL ;  Surgeon: Hollingshead Lamar CHRISTELLA, MD;  Location: [facility];  Service: Endoscopy;  Laterality: N/A;  12:45pm    Current Outpatient Medications  Medication Sig Dispense Refill   albuterol  (VENTOLIN  HFA) 108 (90 Base) MCG/ACT inhaler Inhale 2 puffs into the lungs every 6 (six) hours as needed for wheezing or shortness of breath. 8 g 2   baclofen  (LIORESAL ) 10 MG tablet Take 1 tablet (10 mg total) by mouth 2 (two) times daily. 60 each 2   clobetasol  ointment (TEMOVATE ) 0.05 % Apply 1 Application topically 2 (two) times daily. 60 g 1   cyanocobalamin (VITAMIN B12) 1000 MCG tablet Take 1,000 mcg by mouth daily.     diazepam  (VALIUM ) 5 MG tablet Take 1 tablet (5 mg total) by mouth every 6 (six) hours as needed for anxiety. 20 tablet 0   gabapentin  (NEURONTIN ) 300 MG capsule Take 1 capsule (300 mg total) by mouth 3 (three) times daily. 90 capsule 5   losartan  (COZAAR ) 100 MG tablet Take 1 tablet (100 mg total) by mouth daily. 90 tablet 1   Melatonin 5 MG CAPS Take 3 each by mouth at bedtime as needed.     meloxicam  (MOBIC ) 15 MG tablet Take 1 tablet (15 mg total) by mouth daily. 30 tablet 3   Multiple Vitamin (MULTIVITAMIN) tablet Take 1 tablet by mouth daily.     omeprazole  (PRILOSEC) 40 MG capsule Take  1 capsule (40 mg total) by mouth 2 (two) times daily. 60 capsule 3   No current facility-administered medications for this visit.    Allergies as of 09/04/2023 - Review Complete 08/14/2023  Allergen Reaction Noted   Sulfa antibiotics Nausea And Vomiting 10/16/2010    Family History  Problem Relation Age of Onset   Colon cancer Paternal Grandfather        62s or 34s   Anesthesia problems Neg Hx    Hypotension Neg Hx    Malignant hyperthermia Neg Hx    Pseudochol deficiency Neg Hx     Social History   Socioeconomic History   Marital status: Married    Spouse name: Not on file   Number of children: Not on file   Years of education: Not on file    Highest education level: GED or equivalent  Occupational History   Not on file  Tobacco Use   Smoking status: Every Day    Current packs/day: 1.00    Average packs/day: 1 pack/day for 40.0 years (40.0 ttl pk-yrs)    Types: Cigarettes   Smokeless tobacco: Never   Tobacco comments:    13 cigarettes a day as of 06/30/23  Vaping Use   Vaping status: Never Used  Substance and Sexual Activity   Alcohol use: Yes    Alcohol/week: 4.0 standard drinks of alcohol    Types: 4 Cans of beer per week    Comment: 3-4 beers a night   Drug use: No   Sexual activity: Not on file  Other Topics Concern   Not on file  Social History Narrative   Not on file   Social Drivers of Health   Financial Resource Strain: Medium Risk (07/22/2023)   Overall Financial Resource Strain (CARDIA)    Difficulty of Paying Living Expenses: Somewhat hard  Food Insecurity: No Food Insecurity (07/22/2023)   Hunger Vital Sign    Worried About Running Out of Food in the Last Year: Never true    Ran Out of Food in the Last Year: Never true  Recent Concern: Food Insecurity - Food Insecurity Present (06/27/2023)   Hunger Vital Sign    Worried About Running Out of Food in the Last Year: Sometimes true    Ran Out of Food in the Last Year: Sometimes true  Transportation Needs: Unmet Transportation Needs (07/22/2023)   PRAPARE - Administrator, Civil Service (Medical): Yes    Lack of Transportation (Non-Medical): No  Physical Activity: Inactive (07/22/2023)   Exercise Vital Sign    Days of Exercise per Week: 0 days    Minutes of Exercise per Session: Not on file  Stress: Stress Concern Present (07/22/2023)   [employer] of Occupational Health - Occupational Stress Questionnaire    Feeling of Stress: To some extent  Social Connections: Unknown (07/22/2023)   Social Connection and Isolation Panel    Frequency of Communication with Friends and Family: Once a week    Frequency of Social Gatherings with Friends and  Family: Patient declined    Attends Religious Services: Never    Database administrator or Organizations: No    Attends Engineer, structural: Not on file    Marital Status: Married    Review of Systems: Gen: Denies fever, chills, anorexia. Denies fatigue, weakness, weight loss.  CV: Denies chest pain, palpitations, syncope, peripheral edema, and claudication. Resp: Denies dyspnea at rest, cough, wheezing, coughing up blood, and pleurisy. GI: Denies vomiting blood, jaundice, and  fecal incontinence.   Denies dysphagia or odynophagia. Derm: Denies rash, itching, dry skin Psych: Denies depression, anxiety, memory loss, confusion. No homicidal or suicidal ideation.  Heme: Denies bruising, bleeding, and enlarged lymph nodes.  Physical Exam: There were no vitals taken for this visit. General:   Alert and oriented. No distress noted. Pleasant and cooperative.  Head:  Normocephalic and atraumatic. Eyes:  Conjuctiva clear without scleral icterus. Heart:  S1, S2 present without murmurs appreciated. Lungs:  Clear to auscultation bilaterally. No wheezes, rales, or rhonchi. No distress.  Abdomen:  +BS, soft, non-tender and non-distended. No rebound or guarding. No HSM or masses noted. Msk:  Symmetrical without gross deformities. Normal posture. Extremities:  Without edema. Neurologic:  Alert and  oriented x4 Psych:  Normal mood and affect.    Assessment:     Plan:  ***   Josette Centers, PA-C [facility] 09/04/2023

## 2023-09-04 ENCOUNTER — Ambulatory Visit: Admitting: Gastroenterology

## 2023-09-05 NOTE — Telephone Encounter (Signed)
 Forms picked up

## 2023-09-12 ENCOUNTER — Inpatient Hospital Stay (HOSPITAL_COMMUNITY)
Admission: EM | Admit: 2023-09-12 | Discharge: 2023-09-15 | DRG: 440 | Disposition: A | Discharge status: Home / Self Care | Attending: Family Medicine | Admitting: Family Medicine

## 2023-09-12 ENCOUNTER — Emergency Department (HOSPITAL_COMMUNITY)

## 2023-09-12 ENCOUNTER — Other Ambulatory Visit: Payer: Self-pay

## 2023-09-12 ENCOUNTER — Encounter (HOSPITAL_COMMUNITY): Payer: Self-pay

## 2023-09-12 DIAGNOSIS — K852 Alcohol induced acute pancreatitis without necrosis or infection: Secondary | ICD-10-CM | POA: Diagnosis present

## 2023-09-12 DIAGNOSIS — Z79899 Other long term (current) drug therapy: Secondary | ICD-10-CM

## 2023-09-12 DIAGNOSIS — I10 Essential (primary) hypertension: Secondary | ICD-10-CM | POA: Diagnosis present

## 2023-09-12 DIAGNOSIS — Z800 Family history of malignant neoplasm of digestive organs: Secondary | ICD-10-CM | POA: Diagnosis not present

## 2023-09-12 DIAGNOSIS — Z882 Allergy status to sulfonamides status: Secondary | ICD-10-CM | POA: Diagnosis not present

## 2023-09-12 DIAGNOSIS — F17210 Nicotine dependence, cigarettes, uncomplicated: Secondary | ICD-10-CM | POA: Diagnosis present

## 2023-09-12 DIAGNOSIS — K859 Acute pancreatitis without necrosis or infection, unspecified: Secondary | ICD-10-CM | POA: Diagnosis present

## 2023-09-12 DIAGNOSIS — F1010 Alcohol abuse, uncomplicated: Secondary | ICD-10-CM | POA: Diagnosis present

## 2023-09-12 DIAGNOSIS — Z720 Tobacco use: Secondary | ICD-10-CM

## 2023-09-12 DIAGNOSIS — K8591 Acute pancreatitis with uninfected necrosis, unspecified: Secondary | ICD-10-CM | POA: Diagnosis not present

## 2023-09-12 DIAGNOSIS — M545 Low back pain, unspecified: Secondary | ICD-10-CM

## 2023-09-12 DIAGNOSIS — K858 Other acute pancreatitis without necrosis or infection: Secondary | ICD-10-CM

## 2023-09-12 DIAGNOSIS — K219 Gastro-esophageal reflux disease without esophagitis: Secondary | ICD-10-CM | POA: Diagnosis present

## 2023-09-12 DIAGNOSIS — K2980 Duodenitis without bleeding: Secondary | ICD-10-CM | POA: Diagnosis present

## 2023-09-12 DIAGNOSIS — J449 Chronic obstructive pulmonary disease, unspecified: Secondary | ICD-10-CM | POA: Diagnosis present

## 2023-09-12 LAB — CBC
HCT: 46.4 % (ref 39.0–52.0)
Hemoglobin: 16.9 g/dL (ref 13.0–17.0)
MCH: 33.4 pg (ref 26.0–34.0)
MCHC: 36.4 g/dL — ABNORMAL HIGH (ref 30.0–36.0)
MCV: 91.7 fL (ref 80.0–100.0)
Platelets: 266 K/uL (ref 150–400)
RBC: 5.06 MIL/uL (ref 4.22–5.81)
RDW: 11.7 % (ref 11.5–15.5)
WBC: 10.1 K/uL (ref 4.0–10.5)
nRBC: 0 % (ref 0.0–0.2)

## 2023-09-12 LAB — COMPREHENSIVE METABOLIC PANEL WITH GFR
ALT: 52 U/L — ABNORMAL HIGH (ref 0–44)
AST: 60 U/L — ABNORMAL HIGH (ref 15–41)
Albumin: 4.1 g/dL (ref 3.5–5.0)
Alkaline Phosphatase: 83 U/L (ref 38–126)
Anion gap: 19 — ABNORMAL HIGH (ref 5–15)
BUN: <5 mg/dL — ABNORMAL LOW (ref 8–23)
CO2: 19 mmol/L — ABNORMAL LOW (ref 22–32)
Calcium: 9.2 mg/dL (ref 8.9–10.3)
Chloride: 98 mmol/L (ref 98–111)
Creatinine, Ser: 0.66 mg/dL (ref 0.61–1.24)
GFR, Estimated: >60 mL/min (ref >60)
Glucose, Bld: 85 mg/dL (ref 70–99)
Potassium: 3.9 mmol/L (ref 3.5–5.1)
Sodium: 136 mmol/L (ref 135–145)
Total Bilirubin: 1.9 mg/dL — ABNORMAL HIGH (ref 0.0–1.2)
Total Protein: 7.4 g/dL (ref 6.5–8.1)

## 2023-09-12 LAB — LIPASE, BLOOD: Lipase: 711 U/L — ABNORMAL HIGH (ref 11–51)

## 2023-09-12 LAB — TYPE AND SCREEN
ABO/RH(D): B POS
Antibody Screen: NEGATIVE

## 2023-09-12 LAB — ETHANOL: Alcohol, Ethyl (B): <15 mg/dL (ref <15)

## 2023-09-12 MED ORDER — BACLOFEN 10 MG PO TABS
10.0000 mg | ORAL_TABLET | Freq: Two times a day (BID) | ORAL | Status: DC
  Administered 2023-09-12 – 2023-09-15 (×6): 10 mg via ORAL
  Filled 2023-09-12 (×6): qty 1

## 2023-09-12 MED ORDER — PANTOPRAZOLE SODIUM 40 MG IV SOLR
40.0000 mg | Freq: Once | INTRAVENOUS | Status: AC
  Administered 2023-09-12: 40 mg via INTRAVENOUS
  Filled 2023-09-12: qty 10

## 2023-09-12 MED ORDER — LACTATED RINGERS IV BOLUS
1000.0000 mL | Freq: Once | INTRAVENOUS | Status: AC
  Administered 2023-09-12: 1000 mL via INTRAVENOUS

## 2023-09-12 MED ORDER — ONDANSETRON HCL 4 MG PO TABS: 4.0000 mg | ORAL_TABLET | Freq: Four times a day (QID) | ORAL | Status: DC | PRN

## 2023-09-12 MED ORDER — METOCLOPRAMIDE HCL 5 MG/ML IJ SOLN
10.0000 mg | Freq: Once | INTRAMUSCULAR | Status: AC
  Administered 2023-09-12: 10 mg via INTRAVENOUS
  Filled 2023-09-12: qty 2

## 2023-09-12 MED ORDER — HYDROMORPHONE HCL 1 MG/ML IJ SOLN
1.0000 mg | INTRAMUSCULAR | Status: DC | PRN
  Administered 2023-09-12 – 2023-09-14 (×5): 1 mg via INTRAVENOUS
  Filled 2023-09-12 (×5): qty 1

## 2023-09-12 MED ORDER — HYDROMORPHONE HCL 1 MG/ML IJ SOLN
0.5000 mg | Freq: Once | INTRAMUSCULAR | Status: AC
  Administered 2023-09-12: 0.5 mg via INTRAVENOUS
  Filled 2023-09-12: qty 0.5

## 2023-09-12 MED ORDER — HYDROMORPHONE HCL 1 MG/ML IJ SOLN
1.0000 mg | Freq: Once | INTRAMUSCULAR | Status: AC
  Administered 2023-09-12: 1 mg via INTRAVENOUS
  Filled 2023-09-12: qty 1

## 2023-09-12 MED ORDER — ONDANSETRON HCL 4 MG/2ML IJ SOLN
4.0000 mg | Freq: Four times a day (QID) | INTRAMUSCULAR | Status: DC | PRN
  Administered 2023-09-12: 4 mg via INTRAVENOUS
  Filled 2023-09-12: qty 2

## 2023-09-12 MED ORDER — DIPHENHYDRAMINE HCL 50 MG/ML IJ SOLN
25.0000 mg | Freq: Once | INTRAMUSCULAR | Status: AC
  Administered 2023-09-12: 25 mg via INTRAVENOUS
  Filled 2023-09-12: qty 1

## 2023-09-12 MED ORDER — LIDOCAINE VISCOUS HCL 2 % MT SOLN
15.0000 mL | Freq: Once | OROMUCOSAL | Status: AC
  Administered 2023-09-12: 15 mL via ORAL
  Filled 2023-09-12: qty 15

## 2023-09-12 MED ORDER — ENOXAPARIN SODIUM 40 MG/0.4ML IJ SOSY
40.0000 mg | PREFILLED_SYRINGE | INTRAMUSCULAR | Status: DC
  Administered 2023-09-12 – 2023-09-14 (×3): 40 mg via SUBCUTANEOUS
  Filled 2023-09-12 (×3): qty 0.4

## 2023-09-12 MED ORDER — PANTOPRAZOLE SODIUM 40 MG IV SOLR
40.0000 mg | Freq: Two times a day (BID) | INTRAVENOUS | Status: DC
  Administered 2023-09-12 – 2023-09-15 (×6): 40 mg via INTRAVENOUS
  Filled 2023-09-12 (×6): qty 10

## 2023-09-12 MED ORDER — GABAPENTIN 300 MG PO CAPS
300.0000 mg | ORAL_CAPSULE | Freq: Three times a day (TID) | ORAL | Status: DC
  Administered 2023-09-12 – 2023-09-15 (×8): 300 mg via ORAL
  Filled 2023-09-12 (×8): qty 1

## 2023-09-12 MED ORDER — ONDANSETRON HCL 4 MG/2ML IJ SOLN
4.0000 mg | Freq: Once | INTRAMUSCULAR | Status: AC
  Administered 2023-09-12: 4 mg via INTRAVENOUS
  Filled 2023-09-12: qty 2

## 2023-09-12 MED ORDER — LACTATED RINGERS IV SOLN
INTRAVENOUS | Status: DC
  Administered 2023-09-13: 1000 mL via INTRAVENOUS

## 2023-09-12 MED ORDER — SODIUM CHLORIDE 0.9 % IV BOLUS
1000.0000 mL | Freq: Once | INTRAVENOUS | Status: AC
  Administered 2023-09-12: 1000 mL via INTRAVENOUS

## 2023-09-12 MED ORDER — LOSARTAN POTASSIUM 50 MG PO TABS
100.0000 mg | ORAL_TABLET | Freq: Every day | ORAL | Status: DC
  Administered 2023-09-13 – 2023-09-15 (×3): 100 mg via ORAL
  Filled 2023-09-12 (×3): qty 2

## 2023-09-12 MED ORDER — MELATONIN 3 MG PO TABS
15.0000 mg | ORAL_TABLET | Freq: Every day | ORAL | Status: DC
  Administered 2023-09-12 – 2023-09-14 (×3): 15 mg via ORAL
  Filled 2023-09-12 (×3): qty 5

## 2023-09-12 MED ORDER — IOHEXOL 300 MG/ML  SOLN
100.0000 mL | Freq: Once | INTRAMUSCULAR | Status: AC | PRN
  Administered 2023-09-12: 100 mL via INTRAVENOUS

## 2023-09-12 MED ORDER — ALUM & MAG HYDROXIDE-SIMETH 200-200-20 MG/5ML PO SUSP
30.0000 mL | Freq: Once | ORAL | Status: AC
  Administered 2023-09-12: 30 mL via ORAL
  Filled 2023-09-12: qty 30

## 2023-09-12 NOTE — ED Provider Notes (Signed)
 [place] EMERGENCY DEPARTMENT AT [facility] Provider Note   CSN: [id number] Arrival date & time: 09/12/23  1311     Patient presents with: Abdominal Pain   Colin Ellis is a 63 y.o. male.   Patient is a 63 year old male who presents emergency department chief complaint of epigastric abdominal pain which has been ongoing for the past few days.  Patient does have a history of pancreatitis.  He does note that he has had some nausea and vomiting.  He notes that he has also been experiencing darker than normal stools.  He denies any associated fever, chills, chest pain or shortness of breath.  He has had no associated dizziness, lightheadedness or syncope.   Abdominal Pain      Prior to Admission medications   Medication Sig Start Date End Date Taking? Authorizing Provider  albuterol  (VENTOLIN  HFA) 108 (90 Base) MCG/ACT inhaler Inhale 2 puffs into the lungs every 6 (six) hours as needed for wheezing or shortness of breath. 01/23/22   Golda Lynwood JINNY, MD  baclofen  (LIORESAL ) 10 MG tablet Take 1 tablet (10 mg total) by mouth 2 (two) times daily. 06/17/23   [name], MD  clobetasol  ointment (TEMOVATE ) 0.05 % Apply 1 Application topically 2 (two) times daily. 02/21/23   Patel, Rutwik K, MD  Cyanocobalamin (B-12 PO) Take 1 tablet by mouth daily.    [provider]  cyanocobalamin (VITAMIN B12) 1000 MCG tablet Take 1,000 mcg by mouth daily.    [provider]  gabapentin  (NEURONTIN ) 300 MG capsule Take 1 capsule (300 mg total) by mouth 3 (three) times daily. 06/11/23   Margrette Taft BRAVO, MD  losartan  (COZAAR ) 100 MG tablet Take 1 tablet (100 mg total) by mouth daily. 05/30/23   [name], MD  Melatonin 5 MG CAPS Take 3 each by mouth at bedtime as needed.    [provider]  omeprazole  (PRILOSEC) 40 MG capsule Take 1 capsule (40 mg total) by mouth 2 (two) times daily. 08/08/23   [name], MD  Thiamine  HCl (B-1 PO) Take 1 tablet by mouth  daily.    [provider]    Allergies: Sulfa antibiotics    Review of Systems  Gastrointestinal:  Positive for abdominal pain.  All other systems reviewed and are negative.   Updated Vital Signs BP (!) 167/80   Pulse 66   Temp 98 F (36.7 C) (Oral)   Resp 17   Ht 5' 11 (1.803 m)   Wt 68 kg   SpO2 96%   BMI 20.92 kg/m   Physical Exam Vitals and nursing note reviewed.  Constitutional:      General: He is not in acute distress.    Appearance: Normal appearance. He is not ill-appearing.  HENT:     Head: Normocephalic and atraumatic.     Nose: Nose normal.     Mouth/Throat:     Mouth: Mucous membranes are moist.  Eyes:     Extraocular Movements: Extraocular movements intact.     Conjunctiva/sclera: Conjunctivae normal.     Pupils: Pupils are equal, round, and reactive to light.  Cardiovascular:     Rate and Rhythm: Normal rate and regular rhythm.     Pulses: Normal pulses.     Heart sounds: Normal heart sounds. No murmur heard.    No gallop.  Pulmonary:     Effort: Pulmonary effort is normal. No respiratory distress.     Breath sounds: Normal breath sounds. No  stridor. No wheezing, rhonchi or rales.  Abdominal:     General: Abdomen is flat. Bowel sounds are normal. There is no distension. There are no signs of injury.     Palpations: Abdomen is soft.     Tenderness: There is abdominal tenderness in the epigastric area. Negative signs include Murphy's sign and McBurney's sign.     Hernia: No hernia is present.  Musculoskeletal:        General: Normal range of motion.     Cervical back: Normal range of motion and neck supple.  Skin:    General: Skin is warm and dry.  Neurological:     General: No focal deficit present.     Mental Status: He is alert and oriented to person, place, and time. Mental status is at baseline.  Psychiatric:        Mood and Affect: Mood normal.        Behavior: Behavior normal.        Thought Content: Thought content normal.         Judgment: Judgment normal.     (all labs ordered are listed, but only abnormal results are displayed) Labs Reviewed  COMPREHENSIVE METABOLIC PANEL WITH GFR - Abnormal; Notable for the following components:      Result Value   CO2 19 (*)    BUN <5 (*)    AST 60 (*)    ALT 52 (*)    Total Bilirubin 1.9 (*)    Anion gap 19 (*)    All other components within normal limits  CBC - Abnormal; Notable for the following components:   MCHC 36.4 (*)    All other components within normal limits  LIPASE, BLOOD - Abnormal; Notable for the following components:   Lipase 711 (*)    All other components within normal limits  ETHANOL  POC OCCULT BLOOD, ED  TYPE AND SCREEN    EKG: None  Radiology: CT ABDOMEN PELVIS W CONTRAST Result Date: 09/12/2023 CLINICAL DATA:  Abdominal pain for several days. Melena. Pancreatitis. EXAM: CT ABDOMEN AND PELVIS WITH CONTRAST TECHNIQUE: Multidetector CT imaging of the abdomen and pelvis was performed using the standard protocol following bolus administration of intravenous contrast. RADIATION DOSE REDUCTION: This exam was performed according to the departmental dose-optimization program which includes automated exposure control, adjustment of the mA and/or kV according to patient size and/or use of iterative reconstruction technique. CONTRAST:  OMNIPAQUE  IOHEXOL  300 MG/ML  SOLN COMPARISON:  12/28/2021 FINDINGS: Lower Chest: No acute findings. Hepatobiliary: No suspicious hepatic masses identified. Increased mild-to-moderate hepatic steatosis since prior study. Gallbladder is unremarkable. No evidence of biliary ductal dilatation. Pancreas: Mild pancreatic edema and peripancreatic soft tissue stranding is seen, consistent with acute pancreatitis. No evidence of pancreatic necrosis or peripancreatic fluid collections. No evidence of pancreatic mass or ductal dilatation. Spleen: Within normal limits in size and appearance. Adrenals/Urinary Tract: No suspicious  masses identified. No evidence of ureteral calculi or hydronephrosis. Unremarkable unopacified urinary bladder. Stomach/Bowel: No evidence of obstruction, inflammatory process or abnormal fluid collections. Diverticulosis is seen mainly involving the sigmoid colon, however there is no evidence of diverticulitis. Vascular/Lymphatic: No pathologically enlarged lymph nodes. No acute vascular findings. Reproductive:  No mass or other significant abnormality. Other:  Tiny amount of free fluid in pelvis. Musculoskeletal: No suspicious bone lesions identified. Fractures of the left lateral and posterior 9th through 11th ribs are seen, which show incomplete healing, and are likely subacute in age. Lumbar spine fusion hardware noted at L2-3.  IMPRESSION: Mild uncomplicated acute pancreatitis. Increased mild-to-moderate hepatic steatosis. Colonic diverticulosis, without radiographic evidence of diverticulitis. Subacute fractures of the left 9th through 11th ribs. Electronically Signed   By: Norleen DELENA Kil M.D.   On: 09/12/2023 16:49     Procedures   Medications Ordered in the ED  lactated ringers  bolus 1,000 mL (has no administration in time range)  HYDROmorphone  (DILAUDID ) injection 1 mg (1 mg Intravenous Given 09/12/23 1405)  ondansetron  (ZOFRAN ) injection 4 mg (4 mg Intravenous Given 09/12/23 1404)  pantoprazole  (PROTONIX ) injection 40 mg (40 mg Intravenous Given 09/12/23 1403)  sodium chloride  0.9 % bolus 1,000 mL (0 mLs Intravenous Stopped 09/12/23 1459)  HYDROmorphone  (DILAUDID ) injection 0.5 mg (0.5 mg Intravenous Given 09/12/23 1451)  metoCLOPramide  (REGLAN ) injection 10 mg (10 mg Intravenous Given 09/12/23 1454)  diphenhydrAMINE  (BENADRYL ) injection 25 mg (25 mg Intravenous Given 09/12/23 1452)  alum & mag hydroxide-simeth (MAALOX/MYLANTA) 200-200-20 MG/5ML suspension 30 mL (30 mLs Oral Given 09/12/23 1457)    And  lidocaine  (XYLOCAINE ) 2 % viscous mouth solution 15 mL (15 mLs Oral Given 09/12/23 1457)  iohexol   (OMNIPAQUE ) 300 MG/ML solution 100 mL (100 mLs Intravenous Contrast Given 09/12/23 1619)                                    Medical Decision Making Amount and/or Complexity of Data Reviewed Labs: ordered. Radiology: ordered.  Risk OTC drugs. Prescription drug management. Decision regarding hospitalization.   This patient presents to the ED for concern of abdominal pain, this involves an extensive number of treatment options, and is a complaint that carries with it a high risk of complications and morbidity.  The differential diagnosis includes acute appendicitis, cholecystitis, bowel obstruction, diverticulitis, testicular torsion, bowel nephritis, kidney stone, pancreatitis, mesenteric ischemia   Co morbidities that complicate the patient evaluation  Alcohol use, history of pancreatitis   Additional history obtained:  Additional history obtained from wife External records from outside source obtained and reviewed including medical records   Lab Tests:  I Ordered, and personally interpreted labs.  The pertinent results include: No leukocytosis, no anemia, normal kidney function, elevated AST, ALT and total bilirubin, normal electrolytes, elevated lipase, negative alcohol   Imaging Studies ordered:  I ordered imaging studies including CT scan of abdomen and pelvis I independently visualized and interpreted imaging which showed uncomplicated acute pancreatitis I agree with the radiologist interpretation    Consultations Obtained:  I requested consultation with the hospitalist,  and discussed lab and imaging findings as well as pertinent plan - they recommend: Admission   Problem List / ED Course / Critical interventions / Medication management  Patient is doing well at this time.  Pain has greatly improved with treatment in the emergency department.  Blood work and imaging is consistent with acute pancreatitis at this point.  No other acute surgical process was noted.   Patient has had stable vital signs.  He has no signs of acute respiratory distress.  Patient has been started on IV fluids.  Did discuss patient case with Dr. Leilani with the hospitalist service who has instructed for admission. I ordered medication including Dilaudid , Zofran , Protonix , Reglan , Benadryl , GI cocktail, IV fluids for acute pancreatitis Reevaluation of the patient after these medicines showed that the patient improved I have reviewed the patients home medicines and have made adjustments as needed   Social Determinants of Health:  None   Test / Admission -  Considered:  Admission     Final diagnoses:  Acute pancreatitis, unspecified complication status, unspecified pancreatitis type    ED Discharge Orders     None          Daralene Lonni JONETTA DEVONNA 09/12/23 1735    Suzette Pac, MD 09/15/23 [phone]

## 2023-09-12 NOTE — H&P (Signed)
 History and Physical    Patient: Colin Ellis FMW:[id number] DOB: 1960-09-23 DOA: 09/12/2023 DOS: the patient was seen and examined on 09/12/2023 PCP: [name], MD  Patient coming from: Home  Chief Complaint:  Chief Complaint  Patient presents with   Abdominal Pain   HPI: Colin Ellis is a 63 y.o. male with medical history significant of hypertension, alcohol use, recent pancreatitis approximately a month ago.  Patient presents with abdominal pain over the last 3 to 4 days that is in his epigastric region and radiates to his left upper quadrant/back.  This has been going on pretty constantly and gets worse when he eats.  He is unable to tolerate oral food and liquids.  He drinks 3-4 beers a day when he is working and 5-6 weekends.  He has not had any alcohol in the past couple of days due to the abdominal pain.  Denies withdrawals.  Review of Systems: As mentioned in the history of present illness. All other systems reviewed and are negative. Past Medical History:  Diagnosis Date   ADH disorder    Arthritis    Electrical injury in adult    Electrician by trade, several shocks during working career    Psoriasis    Teeth missing due to trauma    Past Surgical History:  Procedure Laterality Date   BACK SURGERY     herniated disc   COLONOSCOPY  11/12/2010   Surgeon: Lamar CHRISTELLA Hollingshead, MD;  pancolonic diverticulosis, otherwise normal exam.   COLONOSCOPY WITH PROPOFOL  N/A 07/09/2021   Surgeon: Hollingshead Lamar CHRISTELLA, MD; pancolonic diverticulosis, redundant colon, otherwise normal exam.  Recommended 10-year screening.   Social History:  reports that he has been smoking cigarettes. He has a 40 pack-year smoking history. He has never used smokeless tobacco. He reports current alcohol use of about 4.0 standard drinks of alcohol per week. He reports that he does not use drugs.  Allergies  Allergen Reactions   Sulfa Antibiotics Nausea And Vomiting    Upset stomach    Family  History  Problem Relation Age of Onset   Colon cancer Paternal Grandfather        53s or 70s   Anesthesia problems Neg Hx    Hypotension Neg Hx    Malignant hyperthermia Neg Hx    Pseudochol deficiency Neg Hx     Prior to Admission medications   Medication Sig Start Date End Date Taking? Authorizing Provider  baclofen  (LIORESAL ) 10 MG tablet Take 1 tablet (10 mg total) by mouth 2 (two) times daily. 06/17/23  Yes [name], MD  clobetasol  ointment (TEMOVATE ) 0.05 % Apply 1 Application topically 2 (two) times daily. 02/21/23  Yes [name], MD  Cyanocobalamin (B-12 PO) Take 1 tablet by mouth daily.   Yes [provider]  gabapentin  (NEURONTIN ) 300 MG capsule Take 1 capsule (300 mg total) by mouth 3 (three) times daily. 06/11/23  Yes Margrette Taft BRAVO, MD  losartan  (COZAAR ) 100 MG tablet Take 1 tablet (100 mg total) by mouth daily. 05/30/23  Yes [name], MD  Melatonin 5 MG CAPS Take 3 each by mouth at bedtime.   Yes [provider]  omeprazole  (PRILOSEC) 40 MG capsule Take 1 capsule (40 mg total) by mouth 2 (two) times daily. Patient taking differently: Take 40 mg by mouth daily. 08/08/23  Yes Patel, Rutwik K, MD  Thiamine HCl (B-1 PO) Take 1 tablet by mouth daily.   Yes [provider]  Physical Exam: Vitals:   09/12/23 1515 09/12/23 1530 09/12/23 1545 09/12/23 1600  BP: 138/72 136/73 132/75 (!) 167/80  Pulse: 72 69 68 66  Resp:      Temp:      TempSrc:      SpO2: 93% 93% 94% 96%  Weight:      Height:       General: Elderly male. Awake and alert and oriented x3. No acute cardiopulmonary distress.  HEENT: Normocephalic atraumatic.  Right and left ears normal in appearance.  Pupils equal, round, reactive to light. Extraocular muscles are intact. Sclerae anicteric and noninjected.  Moist mucosal membranes. No mucosal lesions.  Neck: Neck supple without lymphadenopathy. No carotid bruits. No masses palpated.  Cardiovascular: Regular rate  with normal S1-S2 sounds. No murmurs, rubs, gallops auscultated. No JVD.  Respiratory: Good respiratory effort with no wheezes, rales, rhonchi. Lungs clear to auscultation bilaterally.  No accessory muscle use. Abdomen: Soft, tender in the epigastric area with radiation into the left upper quadrant, nondistended. Active bowel sounds. No masses or hepatosplenomegaly  Skin: No rashes, lesions, or ulcerations.  Dry, warm to touch. 2+ dorsalis pedis and radial pulses. Musculoskeletal: No calf or leg pain. All major joints not erythematous nontender.  No upper or lower joint deformation.  Good ROM.  No contractures  Psychiatric: Intact judgment and insight. Pleasant and cooperative. Neurologic: No focal neurological deficits. Strength is 5/5 and symmetric in upper and lower extremities.  Cranial nerves II through XII are grossly intact.  Data Reviewed: Imaging studies and labs reviewed by me  Assessment and Plan: No notes have been filed under this hospital service. Service: Hospitalist  Principal Problem:   Acute pancreatitis Active Problems:   COPD (chronic obstructive pulmonary disease) (HCC)   Essential hypertension   Gastroesophageal reflux disease  Acute pancreatitis N.p.o. IV fluids Recheck lipase in the morning Hypertension Continue antihypertensives GERD Protonix  COPD   Advance Care Planning:   Code Status: Prior full code  Consults: None  Family Communication: None  Severity of Illness: The appropriate patient status for this patient is INPATIENT. Inpatient status is judged to be reasonable and necessary in order to provide the required intensity of service to ensure the patient's safety. The patient's presenting symptoms, physical exam findings, and initial radiographic and laboratory data in the context of their chronic comorbidities is felt to place them at high risk for further clinical deterioration. Furthermore, it is not anticipated that the patient will be medically  stable for discharge from the hospital within 2 midnights of admission.   * I certify that at the point of admission it is my clinical judgment that the patient will require inpatient hospital care spanning beyond 2 midnights from the point of admission due to high intensity of service, high risk for further deterioration and high frequency of surveillance required.*  Author: Beva Remund J Akira Adelsberger, DO 09/12/2023 5:51 PM  For on call review [email or web address].

## 2023-09-12 NOTE — ED Triage Notes (Signed)
 Pt stated that he began having black Bms and severe stomach pains that started a few days ago. Pt has been diagnosed with acute pancreatitis recently. Pt also complaining of cold chills

## 2023-09-13 DIAGNOSIS — I10 Essential (primary) hypertension: Secondary | ICD-10-CM | POA: Diagnosis not present

## 2023-09-13 DIAGNOSIS — K858 Other acute pancreatitis without necrosis or infection: Secondary | ICD-10-CM | POA: Diagnosis not present

## 2023-09-13 LAB — BASIC METABOLIC PANEL WITH GFR
Anion gap: 9 (ref 5–15)
BUN: 6 mg/dL — ABNORMAL LOW (ref 8–23)
CO2: 24 mmol/L (ref 22–32)
Calcium: 8.1 mg/dL — ABNORMAL LOW (ref 8.9–10.3)
Chloride: 103 mmol/L (ref 98–111)
Creatinine, Ser: 0.7 mg/dL (ref 0.61–1.24)
GFR, Estimated: >60 mL/min (ref >60)
Glucose, Bld: 76 mg/dL (ref 70–99)
Potassium: 3.8 mmol/L (ref 3.5–5.1)
Sodium: 136 mmol/L (ref 135–145)

## 2023-09-13 LAB — HIV ANTIBODY (ROUTINE TESTING W REFLEX): HIV Screen 4th Generation wRfx: NONREACTIVE

## 2023-09-13 LAB — LIPASE, BLOOD: Lipase: 842 U/L — ABNORMAL HIGH (ref 11–51)

## 2023-09-13 MED ORDER — LABETALOL HCL 5 MG/ML IV SOLN: 10.0000 mg | INTRAVENOUS | Status: DC | PRN

## 2023-09-13 MED ORDER — DIAZEPAM 2 MG PO TABS
2.0000 mg | ORAL_TABLET | Freq: Three times a day (TID) | ORAL | Status: DC
  Administered 2023-09-13 – 2023-09-14 (×4): 2 mg via ORAL
  Filled 2023-09-13 (×4): qty 1

## 2023-09-13 MED ORDER — FOLIC ACID 1 MG PO TABS
1.0000 mg | ORAL_TABLET | Freq: Every day | ORAL | Status: DC
  Administered 2023-09-13 – 2023-09-15 (×3): 1 mg via ORAL
  Filled 2023-09-13 (×3): qty 1

## 2023-09-13 MED ORDER — LACTATED RINGERS IV SOLN
INTRAVENOUS | Status: DC
  Administered 2023-09-13: 1000 mL via INTRAVENOUS

## 2023-09-13 MED ORDER — LORAZEPAM 2 MG/ML IJ SOLN: 1.0000 mg | INTRAMUSCULAR | Status: DC | PRN

## 2023-09-13 MED ORDER — LORAZEPAM 1 MG PO TABS: 1.0000 mg | ORAL_TABLET | ORAL | Status: DC | PRN

## 2023-09-13 MED ORDER — THIAMINE MONONITRATE 100 MG PO TABS
100.0000 mg | ORAL_TABLET | Freq: Every day | ORAL | Status: DC
  Administered 2023-09-13 – 2023-09-15 (×3): 100 mg via ORAL
  Filled 2023-09-13 (×3): qty 1

## 2023-09-13 MED ORDER — ADULT MULTIVITAMIN W/MINERALS CH
1.0000 | ORAL_TABLET | Freq: Every day | ORAL | Status: DC
  Administered 2023-09-13 – 2023-09-15 (×3): 1 via ORAL
  Filled 2023-09-13 (×3): qty 1

## 2023-09-13 MED ORDER — THIAMINE HCL 100 MG/ML IJ SOLN: 100.0000 mg | Freq: Every day | INTRAMUSCULAR | Status: DC

## 2023-09-13 NOTE — Plan of Care (Signed)
   Problem: Education: Goal: Knowledge of General Education information will improve Description Including pain rating scale, medication(s)/side effects and non-pharmacologic comfort measures Outcome: Progressing

## 2023-09-13 NOTE — Progress Notes (Signed)
 PROGRESS NOTE   Colin Ellis, is a 63 y.o. male, DOB - May 11, 1960, FMW:[id number]  Admit date - 09/12/2023   Admitting Physician Lang JINNY Peel, DO  Outpatient Primary MD for the patient is [name], MD  LOS - 1  Chief Complaint  Patient presents with   Abdominal Pain      Brief Narrative:  63 y.o. male with medical history significant of hypertension, alcohol use, with history of recurrent pancreatitis admitted on 09/12/2023 with another episode of acute pancreatitis    -Assessment and Plan: 1) acute alcoholic pancreatitis--- Lipase [id number] -Abdominal pain and nausea is not worse --- Patient requesting diet - Try liquid diet - Continue IV fluids - Continue as needed antiemetics and opiates - Protonix  for presumed reactive duodenitis in the setting of acute pancreatitis  2) alcohol abuse--last EtOH intake about 48 hours prior to admission - High risk for DTs - Benzos per CIWA protocol- -thiamine , folic acid  and multivitamin as ordered  3)HTN--continue losartan  - Abdominal as needed  Status is: Inpatient   Disposition: The patient is from: Home              Anticipated d/c is to: Home              Anticipated d/c date is: 2 days              Patient currently is not medically stable to d/c. Barriers: Not Clinically Stable-   Code Status :  -  Code Status: Full Code   Family Communication:    NA (patient is alert, awake and coherent)   DVT Prophylaxis  :   - SCDs  enoxaparin  (LOVENOX ) injection 40 mg Start: 09/12/23 2200   Lab Results  Component Value Date   PLT 266 09/12/2023    Inpatient Medications  Scheduled Meds:  baclofen   10 mg Oral BID   diazepam   2 mg Oral TID   enoxaparin  (LOVENOX ) injection  40 mg Subcutaneous Q24H   folic acid   1 mg Oral Daily   gabapentin   300 mg Oral TID   losartan   100 mg Oral Daily   melatonin  15 mg Oral QHS   multivitamin with minerals  1 tablet Oral Daily   pantoprazole  (PROTONIX ) IV  40 mg Intravenous Q12H    thiamine   100 mg Oral Daily   Or   thiamine   100 mg Intravenous Daily   Continuous Infusions:  lactated ringers  1,000 mL (09/13/23 1521)   PRN Meds:.HYDROmorphone  (DILAUDID ) injection, LORazepam  **OR** LORazepam , ondansetron  **OR** ondansetron  (ZOFRAN ) IV   Anti-infectives (From admission, onward)    None         Subjective: Norleen Patterson today has no fevers, no emesis,  No chest pain,    - Abdominal pain and nausea is not worse  Objective: Vitals:   09/13/23 0150 09/13/23 0600 09/13/23 1218 09/13/23 1754  BP: (!) 140/92 (!) 157/79 138/78 134/83  Pulse: (!) 58 (!) 57 (!) 57 67  Resp: 16 16 16 16   Temp:   98.2 F (36.8 C) 99.1 F (37.3 C)  TempSrc: Oral  Oral Oral  SpO2: 98% 97% 96% 99%  Weight:      Height:        Intake/Output Summary (Last 24 hours) at 09/13/2023 1843 Last data filed at 09/13/2023 1624 Gross per 24 hour  Intake 480 ml  Output --  Net 480 ml   Filed Weights   09/12/23 1319  Weight: 68 kg    Physical Exam  Gen:- Awake Alert,  in no apparent distress  HEENT:- .AT, No sclera icterus Neck-Supple Neck,No JVD,.  Lungs-  CTAB , fair symmetrical air movement CV- S1, S2 normal, regular  Abd-  +ve B.Sounds, Abd Soft, epigastric and left upper quadrant tenderness without rebound or guarding  extremity/Skin:- No  edema, pedal pulses present  Psych-affect is appropriate, oriented x3 Neuro-no new focal deficits, no significant tremors  Data Reviewed: I have personally reviewed following labs and imaging studies  CBC: Recent Labs  Lab 09/12/23 1320  WBC 10.1  HGB 16.9  HCT 46.4  MCV 91.7  PLT 266   Basic Metabolic Panel: Recent Labs  Lab 09/12/23 1320 09/13/23 0446  NA 136 136  K 3.9 3.8  CL 98 103  CO2 19* 24  GLUCOSE 85 76  BUN <5* 6*  CREATININE 0.66 0.70  CALCIUM 9.2 8.1*   GFR: Estimated Creatinine Clearance: 90.9 mL/min (by C-G formula based on SCr of 0.7 mg/dL). Liver Function Tests: Recent Labs  Lab  09/12/23 1320  AST 60*  ALT 52*  ALKPHOS 83  BILITOT 1.9*  PROT 7.4  ALBUMIN 4.1   Radiology Studies: CT ABDOMEN PELVIS W CONTRAST Result Date: 09/12/2023 CLINICAL DATA:  Abdominal pain for several days. Melena. Pancreatitis. EXAM: CT ABDOMEN AND PELVIS WITH CONTRAST TECHNIQUE: Multidetector CT imaging of the abdomen and pelvis was performed using the standard protocol following bolus administration of intravenous contrast. RADIATION DOSE REDUCTION: This exam was performed according to the departmental dose-optimization program which includes automated exposure control, adjustment of the mA and/or kV according to patient size and/or use of iterative reconstruction technique. CONTRAST:  OMNIPAQUE  IOHEXOL  300 MG/ML  SOLN COMPARISON:  12/28/2021 FINDINGS: Lower Chest: No acute findings. Hepatobiliary: No suspicious hepatic masses identified. Increased mild-to-moderate hepatic steatosis since prior study. Gallbladder is unremarkable. No evidence of biliary ductal dilatation. Pancreas: Mild pancreatic edema and peripancreatic soft tissue stranding is seen, consistent with acute pancreatitis. No evidence of pancreatic necrosis or peripancreatic fluid collections. No evidence of pancreatic mass or ductal dilatation. Spleen: Within normal limits in size and appearance. Adrenals/Urinary Tract: No suspicious masses identified. No evidence of ureteral calculi or hydronephrosis. Unremarkable unopacified urinary bladder. Stomach/Bowel: No evidence of obstruction, inflammatory process or abnormal fluid collections. Diverticulosis is seen mainly involving the sigmoid colon, however there is no evidence of diverticulitis. Vascular/Lymphatic: No pathologically enlarged lymph nodes. No acute vascular findings. Reproductive:  No mass or other significant abnormality. Other:  Tiny amount of free fluid in pelvis. Musculoskeletal: No suspicious bone lesions identified. Fractures of the left lateral and posterior 9th  through 11th ribs are seen, which show incomplete healing, and are likely subacute in age. Lumbar spine fusion hardware noted at L2-3. IMPRESSION: Mild uncomplicated acute pancreatitis. Increased mild-to-moderate hepatic steatosis. Colonic diverticulosis, without radiographic evidence of diverticulitis. Subacute fractures of the left 9th through 11th ribs. Electronically Signed   By: Norleen DELENA Kil M.D.   On: 09/12/2023 16:49   Scheduled Meds:  baclofen   10 mg Oral BID   diazepam   2 mg Oral TID   enoxaparin  (LOVENOX ) injection  40 mg Subcutaneous Q24H   folic acid   1 mg Oral Daily   gabapentin   300 mg Oral TID   losartan   100 mg Oral Daily   melatonin  15 mg Oral QHS   multivitamin with minerals  1 tablet Oral Daily   pantoprazole  (PROTONIX ) IV  40 mg Intravenous Q12H   thiamine   100 mg Oral Daily   Or   thiamine   100 mg Intravenous Daily   Continuous Infusions:  lactated ringers  1,000 mL (09/13/23 1521)     LOS: 1 day    Rendall Carwin M.D on 09/13/2023 at 6:43 PM  Go to [email or web address] - for contact info  [facility] - Office  [phone]  If 7PM-7AM, please contact night-coverage [email or web address] 09/13/2023, 6:43 PM

## 2023-09-13 NOTE — Plan of Care (Signed)

## 2023-09-14 DIAGNOSIS — I10 Essential (primary) hypertension: Secondary | ICD-10-CM | POA: Diagnosis not present

## 2023-09-14 DIAGNOSIS — K8591 Acute pancreatitis with uninfected necrosis, unspecified: Secondary | ICD-10-CM

## 2023-09-14 DIAGNOSIS — K219 Gastro-esophageal reflux disease without esophagitis: Secondary | ICD-10-CM

## 2023-09-14 LAB — COMPREHENSIVE METABOLIC PANEL WITH GFR
ALT: 33 U/L (ref 0–44)
AST: 38 U/L (ref 15–41)
Albumin: 2.9 g/dL — ABNORMAL LOW (ref 3.5–5.0)
Alkaline Phosphatase: 64 U/L (ref 38–126)
Anion gap: 9 (ref 5–15)
BUN: <5 mg/dL — ABNORMAL LOW (ref 8–23)
CO2: 25 mmol/L (ref 22–32)
Calcium: 8.2 mg/dL — ABNORMAL LOW (ref 8.9–10.3)
Chloride: 104 mmol/L (ref 98–111)
Creatinine, Ser: 0.61 mg/dL (ref 0.61–1.24)
GFR, Estimated: >60 mL/min
Glucose, Bld: 87 mg/dL (ref 70–99)
Potassium: 3.5 mmol/L (ref 3.5–5.1)
Sodium: 138 mmol/L (ref 135–145)
Total Bilirubin: 1.1 mg/dL (ref 0.0–1.2)
Total Protein: 5.3 g/dL — ABNORMAL LOW (ref 6.5–8.1)

## 2023-09-14 LAB — LIPASE, BLOOD: Lipase: 349 U/L — ABNORMAL HIGH (ref 11–51)

## 2023-09-14 MED ORDER — POTASSIUM CHLORIDE CRYS ER 20 MEQ PO TBCR
40.0000 meq | EXTENDED_RELEASE_TABLET | Freq: Once | ORAL | Status: AC
  Administered 2023-09-14: 40 meq via ORAL
  Filled 2023-09-14: qty 2

## 2023-09-14 MED ORDER — LACTATED RINGERS IV SOLN
INTRAVENOUS | Status: AC
  Administered 2023-09-14: 1000 mL via INTRAVENOUS

## 2023-09-14 MED ORDER — DIAZEPAM 2 MG PO TABS
2.0000 mg | ORAL_TABLET | Freq: Three times a day (TID) | ORAL | Status: DC
  Administered 2023-09-14 – 2023-09-15 (×3): 2 mg via ORAL
  Filled 2023-09-14 (×4): qty 1

## 2023-09-14 NOTE — Progress Notes (Signed)
   09/14/23 2011  TOC Brief Assessment  Insurance and Status Reviewed  Patient has primary care physician Yes  Home environment has been reviewed From home  Prior level of function: Independent  Prior/Current Home Services No current home services  Social Drivers of Health Review SDOH reviewed no interventions necessary  Readmission risk has been reviewed Yes  Transition of care needs no transition of care needs at this time

## 2023-09-14 NOTE — Plan of Care (Signed)

## 2023-09-14 NOTE — Progress Notes (Signed)
 PROGRESS NOTE   Colin Ellis, is a 63 y.o. male, DOB - 1960-07-31, FMW:[id number]  Admit date - 09/12/2023   Admitting Physician Lang JINNY Peel, DO  Outpatient Primary MD for the patient is [name], MD  LOS - 2  Chief Complaint  Patient presents with   Abdominal Pain      Brief Narrative:  63 y.o. male with medical history significant of hypertension, alcohol use, with history of recurrent pancreatitis admitted on 09/12/2023 with another episode of acute pancreatitis    -Assessment and Plan: 1) acute alcoholic pancreatitis--- Lipase [id number]>>>[id number] -Abdominal pain and nausea is not worse --- Patient requesting diet - Try liquid diet - Continue IV fluids - Continue as needed antiemetics and opiates - Protonix  for presumed reactive duodenitis in the setting of acute pancreatitis  2) alcohol abuse--last EtOH intake about 48 hours prior to admission - Mild tremors----coherent and cooperative - Benzos per CIWA protocol- -thiamine , folic acid  and multivitamin as ordered  3)HTN--continue losartan  - Abdominal as needed  Status is: Inpatient   Disposition: The patient is from: Home              Anticipated d/c is to: Home              Anticipated d/c date is: 1 day              Patient currently is not medically stable to d/c. Barriers: Not Clinically Stable-   Code Status :  -  Code Status: Full Code   Family Communication:    NA (patient is alert, awake and coherent)   DVT Prophylaxis  :   - SCDs  enoxaparin  (LOVENOX ) injection 40 mg Start: 09/12/23 2200   Lab Results  Component Value Date   PLT 266 09/12/2023    Inpatient Medications  Scheduled Meds:  baclofen   10 mg Oral BID   diazepam   2 mg Oral TID   enoxaparin  (LOVENOX ) injection  40 mg Subcutaneous Q24H   folic acid   1 mg Oral Daily   gabapentin   300 mg Oral TID   losartan   100 mg Oral Daily   melatonin  15 mg Oral QHS   multivitamin with minerals  1 tablet Oral Daily   pantoprazole   (PROTONIX ) IV  40 mg Intravenous Q12H   thiamine   100 mg Oral Daily   Or   thiamine   100 mg Intravenous Daily   Continuous Infusions:  lactated ringers  1,000 mL (09/14/23 1001)   PRN Meds:.HYDROmorphone  (DILAUDID ) injection, labetalol , LORazepam  **OR** LORazepam , ondansetron  **OR** ondansetron  (ZOFRAN ) IV   Anti-infectives (From admission, onward)    None         Subjective: Colin Ellis today has no fevers, no emesis,  No chest pain,    - Abdominal pain and nausea is not worse -Wants diet advanced - Mild tremors  Objective: Vitals:   09/13/23 1754 09/13/23 2041 09/14/23 0000 09/14/23 0602  BP: 134/83 133/77 132/74 116/74  Pulse: 67 64 65 62  Resp: 16 19  17   Temp: 99.1 F (37.3 C) 99.2 F (37.3 C)  98.5 F (36.9 C)  TempSrc: Oral Oral  Oral  SpO2: 99% 93%  95%  Weight:      Height:        Intake/Output Summary (Last 24 hours) at 09/14/2023 1133 Last data filed at 09/13/2023 1624 Gross per 24 hour  Intake 480 ml  Output --  Net 480 ml   Filed Weights   09/12/23 1319  Weight: 68  kg    Physical Exam  Gen:- Awake Alert,  in no apparent distress  HEENT:- [place].AT, No sclera icterus Neck-Supple Neck,No JVD,.  Lungs-  CTAB , fair symmetrical air movement CV- S1, S2 normal, regular  Abd-  +ve B.Sounds, Abd Soft, epigastric and left upper quadrant tenderness without rebound or guarding  extremity/Skin:- No  edema, pedal pulses present  Psych-affect is appropriate, oriented x3 Neuro-no new focal deficits, mild tremors  Data Reviewed: I have personally reviewed following labs and imaging studies  CBC: Recent Labs  Lab 09/12/23 1320  WBC 10.1  HGB 16.9  HCT 46.4  MCV 91.7  PLT 266   Basic Metabolic Panel: Recent Labs  Lab 09/12/23 1320 09/13/23 0446 09/14/23 0433  NA 136 136 138  K 3.9 3.8 3.5  CL 98 103 104  CO2 19* 24 25  GLUCOSE 85 76 87  BUN <5* 6* <5*  CREATININE 0.66 0.70 0.61  CALCIUM 9.2 8.1* 8.2*   GFR: Estimated Creatinine  Clearance: 90.9 mL/min (by C-G formula based on SCr of 0.61 mg/dL). Liver Function Tests: Recent Labs  Lab 09/12/23 1320 09/14/23 0433  AST 60* 38  ALT 52* 33  ALKPHOS 83 64  BILITOT 1.9* 1.1  PROT 7.4 5.3*  ALBUMIN 4.1 2.9*   Radiology Studies: CT ABDOMEN PELVIS W CONTRAST Result Date: 09/12/2023 CLINICAL DATA:  Abdominal pain for several days. Melena. Pancreatitis. EXAM: CT ABDOMEN AND PELVIS WITH CONTRAST TECHNIQUE: Multidetector CT imaging of the abdomen and pelvis was performed using the standard protocol following bolus administration of intravenous contrast. RADIATION DOSE REDUCTION: This exam was performed according to the departmental dose-optimization program which includes automated exposure control, adjustment of the mA and/or kV according to patient size and/or use of iterative reconstruction technique. CONTRAST:  OMNIPAQUE  IOHEXOL  300 MG/ML  SOLN COMPARISON:  12/28/2021 FINDINGS: Lower Chest: No acute findings. Hepatobiliary: No suspicious hepatic masses identified. Increased mild-to-moderate hepatic steatosis since prior study. Gallbladder is unremarkable. No evidence of biliary ductal dilatation. Pancreas: Mild pancreatic edema and peripancreatic soft tissue stranding is seen, consistent with acute pancreatitis. No evidence of pancreatic necrosis or peripancreatic fluid collections. No evidence of pancreatic mass or ductal dilatation. Spleen: Within normal limits in size and appearance. Adrenals/Urinary Tract: No suspicious masses identified. No evidence of ureteral calculi or hydronephrosis. Unremarkable unopacified urinary bladder. Stomach/Bowel: No evidence of obstruction, inflammatory process or abnormal fluid collections. Diverticulosis is seen mainly involving the sigmoid colon, however there is no evidence of diverticulitis. Vascular/Lymphatic: No pathologically enlarged lymph nodes. No acute vascular findings. Reproductive:  No mass or other significant abnormality.  Other:  Tiny amount of free fluid in pelvis. Musculoskeletal: No suspicious bone lesions identified. Fractures of the left lateral and posterior 9th through 11th ribs are seen, which show incomplete healing, and are likely subacute in age. Lumbar spine fusion hardware noted at L2-3. IMPRESSION: Mild uncomplicated acute pancreatitis. Increased mild-to-moderate hepatic steatosis. Colonic diverticulosis, without radiographic evidence of diverticulitis. Subacute fractures of the left 9th through 11th ribs. Electronically Signed   By: Colin DELENA Kil M.D.   On: 09/12/2023 16:49   Scheduled Meds:  baclofen   10 mg Oral BID   diazepam   2 mg Oral TID   enoxaparin  (LOVENOX ) injection  40 mg Subcutaneous Q24H   folic acid   1 mg Oral Daily   gabapentin   300 mg Oral TID   losartan   100 mg Oral Daily   melatonin  15 mg Oral QHS   multivitamin with minerals  1 tablet Oral Daily  pantoprazole  (PROTONIX ) IV  40 mg Intravenous Q12H   thiamine   100 mg Oral Daily   Or   thiamine   100 mg Intravenous Daily   Continuous Infusions:  lactated ringers  1,000 mL (09/14/23 1001)     LOS: 2 days    Rendall Carwin M.D on 09/14/2023 at 11:33 AM  Go to [email or web address] - for contact info  [facility] - Office  [phone]  If 7PM-7AM, please contact night-coverage [email or web address] 09/14/2023, 11:33 AM

## 2023-09-14 NOTE — Plan of Care (Signed)
   Problem: Education: Goal: Knowledge of General Education information will improve Description Including pain rating scale, medication(s)/side effects and non-pharmacologic comfort measures Outcome: Progressing

## 2023-09-15 ENCOUNTER — Encounter: Payer: Self-pay | Admitting: Internal Medicine

## 2023-09-15 DIAGNOSIS — K219 Gastro-esophageal reflux disease without esophagitis: Secondary | ICD-10-CM | POA: Diagnosis not present

## 2023-09-15 DIAGNOSIS — K852 Alcohol induced acute pancreatitis without necrosis or infection: Secondary | ICD-10-CM

## 2023-09-15 DIAGNOSIS — I10 Essential (primary) hypertension: Secondary | ICD-10-CM | POA: Diagnosis not present

## 2023-09-15 MED ORDER — MULTI-VITAMIN/MINERALS PO TABS: 1.0000 | ORAL_TABLET | Freq: Every day | ORAL | 2 refills | Status: AC

## 2023-09-15 MED ORDER — GABAPENTIN 300 MG PO CAPS: 300.0000 mg | ORAL_CAPSULE | Freq: Three times a day (TID) | ORAL | 5 refills | Status: AC

## 2023-09-15 MED ORDER — FOLIC ACID 1 MG PO TABS: 1.0000 mg | ORAL_TABLET | Freq: Every day | ORAL | 5 refills | Status: AC

## 2023-09-15 MED ORDER — LOSARTAN POTASSIUM 100 MG PO TABS: 100.0000 mg | ORAL_TABLET | Freq: Every day | ORAL | 1 refills | Status: DC

## 2023-09-15 MED ORDER — ONDANSETRON HCL 4 MG PO TABS: 4.0000 mg | ORAL_TABLET | Freq: Four times a day (QID) | ORAL | 0 refills | Status: DC | PRN

## 2023-09-15 MED ORDER — B-1 100 MG PO TABS: 100.0000 mg | ORAL_TABLET | Freq: Every day | ORAL | 5 refills | Status: AC

## 2023-09-15 MED ORDER — B-12 1000 MCG PO TBCR: 1.0000 | EXTENDED_RELEASE_TABLET | Freq: Every day | ORAL | 5 refills | Status: AC

## 2023-09-15 MED ORDER — PANTOPRAZOLE SODIUM 40 MG PO TBEC: 40.0000 mg | DELAYED_RELEASE_TABLET | Freq: Every day | ORAL | 1 refills | Status: DC

## 2023-09-15 NOTE — Discharge Instructions (Signed)
 1) complete abstinence from alcohol advised

## 2023-09-15 NOTE — Plan of Care (Signed)

## 2023-09-15 NOTE — Discharge Summary (Signed)
 Colin Ellis, is a 63 y.o. male  DOB August 09, 1960  MRN [id number].  Admission date:  09/12/2023  Admitting Physician  No admitting provider for patient encounter.  Discharge Date:  09/15/2023   Primary MD  [name], MD  Recommendations for primary care physician for things to follow:  1) complete abstinence from alcohol advised  Admission Diagnosis  Acute pancreatitis [K85.90] Acute pancreatitis, unspecified complication status, unspecified pancreatitis type [K85.90]   Discharge Diagnosis  Acute pancreatitis [K85.90] Acute pancreatitis, unspecified complication status, unspecified pancreatitis type [K85.90]    Principal Problem:   Acute pancreatitis Active Problems:   COPD (chronic obstructive pulmonary disease) (HCC)   Essential hypertension   Gastroesophageal reflux disease      Past Medical History:  Diagnosis Date   ADH disorder    Arthritis    Electrical injury in adult    Electrician by trade, several shocks during working career    Psoriasis    Teeth missing due to trauma     Past Surgical History:  Procedure Laterality Date   BACK SURGERY     herniated disc   COLONOSCOPY  11/12/2010   Surgeon: Lamar CHRISTELLA Hollingshead, MD;  pancolonic diverticulosis, otherwise normal exam.   COLONOSCOPY WITH PROPOFOL  N/A 07/09/2021   Surgeon: Hollingshead Lamar CHRISTELLA, MD; pancolonic diverticulosis, redundant colon, otherwise normal exam.  Recommended 10-year screening.    HPI  from the history and physical done on the day of admission:  HPI: Colin Ellis is a 63 y.o. male with medical history significant of hypertension, alcohol use, recent pancreatitis approximately a month ago.  Patient presents with abdominal pain over the last 3 to 4 days that is in his epigastric region and radiates to his left upper quadrant/back.  This has been going on pretty constantly and gets worse when he eats.  He is unable to  tolerate oral food and liquids.  He drinks 3-4 beers a day when he is working and 5-6 weekends.  He has not had any alcohol in the past couple of days due to the abdominal pain.  Denies withdrawals.   Review of Systems: As mentioned in the history of present illness. All other systems reviewed and are negative.   Hospital Course:   Brief Narrative:  63 y.o. male with medical history significant of hypertension, alcohol use, with history of recurrent pancreatitis admitted on 09/12/2023 with another episode of acute pancreatitis     -Assessment and Plan: 1) acute alcoholic pancreatitis--- Lipase [id number] -No further nausea or vomiting -Abdominal pain resolved -Tolerating solid food -Overall much improved after diuretic treatment.  IV fluids opiates and antiemetics as well as PPI -- Patient received Protonix  for presumed reactive duodenitis in the setting of acute pancreatitis --Complete abstinence from alcohol advised   2) alcohol abuse--last EtOH intake about 48 hours prior to admission - Treated with benzos per CIWA,-thiamine , folic acid  and multivitamin -DT symptoms resolved  3)HTN--stable, continue losartan     Disposition: The patient is from: Home  Anticipated d/c is to: Home  Discharge Condition: stable  Follow UP   Follow-up Information     [name], MD. Schedule an appointment as soon as possible for a visit.   Specialty: Internal Medicine Why: As needed, If symptoms worsen Contact information: [address] [place] [address] [phone]                  Diet and Activity recommendation:  As advised  Discharge Instructions    Discharge Instructions     Ambulatory Referral for Lung Cancer Scre   Complete by: As directed    Call MD for:  difficulty breathing, headache or visual disturbances   Complete by: As directed    Call MD for:  persistant dizziness or light-headedness   Complete by: As directed    Call MD for:   persistant nausea and vomiting   Complete by: As directed    Call MD for:  severe uncontrolled pain   Complete by: As directed    Call MD for:  temperature >100.4   Complete by: As directed    Diet - low sodium heart healthy   Complete by: As directed    Discharge instructions   Complete by: As directed    1) complete abstinence from alcohol advised   Increase activity slowly   Complete by: As directed         Discharge Medications     Allergies as of 09/15/2023       Reactions   Sulfa Antibiotics Nausea And Vomiting   Upset stomach        Medication List     STOP taking these medications    B-12 PO Replaced by: B-12 1000 MCG Tbcr   omeprazole  40 MG capsule Commonly known as: PRILOSEC       TAKE these medications    B-1 100 MG Tabs Take 1 tablet (100 mg total) by mouth daily. What changed:  medication strength how much to take   B-12 1000 MCG Tbcr Take 1 tablet by mouth daily. Replaces: B-12 PO   baclofen  10 MG tablet Commonly known as: LIORESAL  Take 1 tablet (10 mg total) by mouth 2 (two) times daily.   clobetasol  ointment 0.05 % Commonly known as: TEMOVATE  Apply 1 Application topically 2 (two) times daily.   folic acid  1 MG tablet Commonly known as: FOLVITE  Take 1 tablet (1 mg total) by mouth daily.   gabapentin  300 MG capsule Commonly known as: NEURONTIN  Take 1 capsule (300 mg total) by mouth 3 (three) times daily.   losartan  100 MG tablet Commonly known as: COZAAR  Take 1 tablet (100 mg total) by mouth daily.   Melatonin 5 MG Caps Take 3 each by mouth at bedtime.   multivitamin with minerals tablet Take 1 tablet by mouth daily.   ondansetron  4 MG tablet Commonly known as: ZOFRAN  Take 1 tablet (4 mg total) by mouth every 6 (six) hours as needed for nausea.   pantoprazole  40 MG tablet Commonly known as: Protonix  Take 1 tablet (40 mg total) by mouth daily.       Major procedures and Radiology Reports - PLEASE review detailed  and final reports for all details, in brief -   CT ABDOMEN PELVIS W CONTRAST Result Date: 09/12/2023 CLINICAL DATA:  Abdominal pain for several days. Melena. Pancreatitis. EXAM: CT ABDOMEN AND PELVIS WITH CONTRAST TECHNIQUE: Multidetector CT imaging of the abdomen and pelvis was performed using the standard protocol following bolus administration of intravenous  contrast. RADIATION DOSE REDUCTION: This exam was performed according to the departmental dose-optimization program which includes automated exposure control, adjustment of the mA and/or kV according to patient size and/or use of iterative reconstruction technique. CONTRAST:  OMNIPAQUE  IOHEXOL  300 MG/ML  SOLN COMPARISON:  12/28/2021 FINDINGS: Lower Chest: No acute findings. Hepatobiliary: No suspicious hepatic masses identified. Increased mild-to-moderate hepatic steatosis since prior study. Gallbladder is unremarkable. No evidence of biliary ductal dilatation. Pancreas: Mild pancreatic edema and peripancreatic soft tissue stranding is seen, consistent with acute pancreatitis. No evidence of pancreatic necrosis or peripancreatic fluid collections. No evidence of pancreatic mass or ductal dilatation. Spleen: Within normal limits in size and appearance. Adrenals/Urinary Tract: No suspicious masses identified. No evidence of ureteral calculi or hydronephrosis. Unremarkable unopacified urinary bladder. Stomach/Bowel: No evidence of obstruction, inflammatory process or abnormal fluid collections. Diverticulosis is seen mainly involving the sigmoid colon, however there is no evidence of diverticulitis. Vascular/Lymphatic: No pathologically enlarged lymph nodes. No acute vascular findings. Reproductive:  No mass or other significant abnormality. Other:  Tiny amount of free fluid in pelvis. Musculoskeletal: No suspicious bone lesions identified. Fractures of the left lateral and posterior 9th through 11th ribs are seen, which show incomplete healing, and are  likely subacute in age. Lumbar spine fusion hardware noted at L2-3. IMPRESSION: Mild uncomplicated acute pancreatitis. Increased mild-to-moderate hepatic steatosis. Colonic diverticulosis, without radiographic evidence of diverticulitis. Subacute fractures of the left 9th through 11th ribs. Electronically Signed   By: Colin DELENA Kil M.D.   On: 09/12/2023 16:49   MR LUMBAR SPINE WO CONTRAST Result Date: 08/29/2023 CLINICAL DATA:  Chronic back pain EXAM: MRI LUMBAR SPINE WITHOUT CONTRAST TECHNIQUE: Multiplanar, multisequence MR imaging of the lumbar spine was performed. No intravenous contrast was administered. COMPARISON:  None Available. FINDINGS: Bone marrow: No significant abnormality Conus and cauda equina: No significant abnormality Paraspinal tissues: No significant abnormality L1-L2: Previous laminectomy. There is severe facet arthropathy with slight degenerative spondylolisthesis. There is moderate residual spinal stenosis and severe right neural foraminal stenosis L2-L3: Previous PLIF with posterolateral fusion. No spinal stenosis or significant foraminal stenosis L3-L4: There is severe degenerative disc disease with vertebral endplate osteophytes. There is facet arthropathy with facet enlargement and moderate spinal stenosis. There is moderate right and mild left neural foraminal stenosis L4-L5: There is severe degenerative disc disease with a disc bulge and vertebral endplate osteophytes. There is severe facet arthropathy with facet enlargement and moderate to severe spinal stenosis. There is severe right and mild left neural foraminal stenosis L5-S1: The disc is normal. There is moderate facet arthropathy. No spinal stenosis or foraminal stenosis IMPRESSION: Lumbar spondylosis. There has been interval laminectomy at L1-2 and PLIF with posterolateral fusion at L2-3. Different There is moderate spinal stenosis at L3-4 and moderate to severe spinal stenosis at L4-5. Electronically Signed   By: Nancyann Burns  M.D.   On: 08/29/2023 11:15    Today   Subjective    Colin Ellis today has no new complaints -- Eating and drinking well No fever  Or chills   No Nausea, Vomiting or Diarrhea No further  tremors or concerns about DTs   Patient has been seen and examined prior to discharge   Objective   Blood pressure 137/80, pulse 60, temperature 98 F (36.7 C), temperature source Oral, resp. rate 18, height 5' 11 (1.803 m), weight 68 kg, SpO2 94%.   Intake/Output Summary (Last 24 hours) at 09/15/2023 1022 Last data filed at 09/14/2023 1800 Gross per 24 hour  Intake 480  ml  Output --  Net 480 ml    Exam Gen:- Awake Alert, no acute distress  HEENT:- [place].AT, No sclera icterus Neck-Supple Neck,No JVD,.  Lungs-  CTAB , good air movement bilaterally CV- S1, S2 normal, regular Abd-  +ve B.Sounds, Abd Soft, No significant tenderness,    Extremity/Skin:- No  edema,   good pulses Psych-affect is appropriate, oriented x3 Neuro-no new focal deficits, no tremors    Data Review   CBC w Diff:  Lab Results  Component Value Date   WBC 10.1 09/12/2023   HGB 16.9 09/12/2023   HGB 15.2 05/17/2022   HCT 46.4 09/12/2023   HCT 45.4 05/17/2022   PLT 266 09/12/2023   PLT 271 05/17/2022   LYMPHOPCT 20 06/12/2023   MONOPCT 10 06/12/2023   EOSPCT 1 06/12/2023   BASOPCT 1 06/12/2023   CMP:  Lab Results  Component Value Date   NA 138 09/14/2023   NA 138 08/08/2023   K 3.5 09/14/2023   CL 104 09/14/2023   CO2 25 09/14/2023   BUN <5 (L) 09/14/2023   BUN 5 (L) 08/08/2023   CREATININE 0.61 09/14/2023   PROT 5.3 (L) 09/14/2023   PROT 6.4 08/08/2023   ALBUMIN 2.9 (L) 09/14/2023   ALBUMIN 4.1 08/08/2023   BILITOT 1.1 09/14/2023   BILITOT 0.4 08/08/2023   ALKPHOS 64 09/14/2023   AST 38 09/14/2023   ALT 33 09/14/2023   Total Discharge time is about 33 minutes  Rendall Carwin M.D on 09/15/2023 at 10:22 AM  Go to [email or web address] -  for contact info  [facility] - Office   [phone]

## 2023-09-16 NOTE — Telephone Encounter (Signed)
 Called patient scheduled 09.04

## 2023-09-17 ENCOUNTER — Other Ambulatory Visit (INDEPENDENT_AMBULATORY_CARE_PROVIDER_SITE_OTHER): Payer: Self-pay

## 2023-09-17 ENCOUNTER — Ambulatory Visit (INDEPENDENT_AMBULATORY_CARE_PROVIDER_SITE_OTHER): Admitting: Orthopedic Surgery

## 2023-09-17 ENCOUNTER — Telehealth: Payer: Self-pay | Admitting: Orthopedic Surgery

## 2023-09-17 VITALS — BP 125/82 | HR 80 | Ht 71.0 in | Wt 150.0 lb

## 2023-09-17 DIAGNOSIS — Z720 Tobacco use: Secondary | ICD-10-CM | POA: Diagnosis not present

## 2023-09-17 DIAGNOSIS — M545 Low back pain, unspecified: Secondary | ICD-10-CM

## 2023-09-17 NOTE — Progress Notes (Signed)
 Orthopedic Spine Surgery Office Note  Assessment: Patient is a 63 y.o. male with axial low back pain.  No radicular symptoms.  Has stenosis at L3/4 and L4/5.  Also has facet arthropathy at those levels   Plan: -Explained that initially conservative treatment is tried as a significant number of patients may experience relief with these treatment modalities. Discussed that the conservative treatments include:  -activity modification  -physical therapy  -over the counter pain medications  -medrol dosepak  -lumbar steroid injections -Patient has tried Aleve, PT, gabapentin , baclofen   - Discussed all treatment options.  Patient has tried steroid injections and pills in the past.  He was not interested trying those again.  I said that his remaining options would be surgical or pain management.  After covering these options, he wanted to proceed with pain management.  Referral provided to him today -Would need to be nicotine free prior to any elective spine surgery -Patient should return to office on as-needed basis   I spent 5 minutes with the patient covering some of the health risks associated with smoking.  I encouraged cessation.  I specifically covered some of the surgical risks with nicotine use including wound healing issues, infection, pseudoarthrosis.  I said some of these resulted in secondary surgery.  Patient has been working on quitting.  ___________________________________________________________________________   History:  Patient is a 63 y.o. male who presents today for lumbar spine.  Patient had a lumbar spine surgery with Dr. Joshua in 2019.  He did well after surgery.  Within the last 6 months, he has developed severe low back pain.  He feels it in the lower lumbar region.  He is not having any radiating leg pain.  There is no trauma or injury that preceded the onset of the pain.  He notes the pain with standing or walking and he gets better if he sits down.  He has had 2 get out  of work as a result of the pain.  He is on short-term disability.  He cannot lift more than 10 pounds and has to sit down frequently as a result of the pain.   Weakness: Denies Symptoms of imbalance: Denies Paresthesias and numbness: Denies Bowel or bladder incontinence: Denies Saddle anesthesia: Denies  Treatments tried: Aleve, PT, gabapentin , baclofen    Review of systems: Denies fevers and chills, night sweats, unexplained weight loss, history of cancer.  Has had pain that wakes him at night  Past medical history: HTN Psoriasis  Allergies: Sulfa  Past surgical history:  L1-3 laminectomy, L2/3 PLIF  Social history: Reports use of nicotine product (smoking, vaping, patches, smokeless) Alcohol use: Yes, approximately 21 drinks per week Denies recreational drug use   Physical Exam:  BMI of 20.9  General: no acute distress, appears stated age Neurologic: alert, answering questions appropriately, following commands Respiratory: unlabored breathing on room air, symmetric chest rise Psychiatric: appropriate affect, normal cadence to speech   MSK (spine):  -Strength exam      Left  Right EHL    5/5  5/5 TA    5/5  5/5 GSC    5/5  5/5 Knee extension  5/5  5/5 Hip flexion   5/5  5/5  -Sensory exam    Sensation intact to light touch in L3-S1 nerve distributions of bilateral lower extremities  -Achilles DTR: 1/4 on the left, 1/4 on the right -Patellar tendon DTR: 1/4 on the left, 1/4 on the right  -Straight leg raise: Negative bilaterally -Clonus: no beats bilaterally  -  Left hip exam: No pain through range of motion -Right hip exam: No pain through range of motion  Imaging: XRs of the lumbar spine from 09/17/2023 were independently reviewed and interpreted, showing posterior instrumentation at L2/3.  No lucency around the screws.  No screws are backed out.  There is an interbody device in the former L2/3 disc space that appears appropriate position.  Disc height  loss with anterior osteophyte formation at L3/4 and L4/5.  No fracture or dislocation seen.  No evidence of instability in flexion/extension views.  MRI of the lumbar spine from 08/23/2023 was independently reviewed and interpreted, showing DDD at L3/4 and L4/5.  Central and bilateral foraminal stenosis at L3/4.  Central, lateral recess, and bilateral foraminal stenosis at L4/5.  No other significant stenosis seen.   Patient name: Colin Ellis Patient MRN: [id number] Date of visit: 09/17/23

## 2023-09-17 NOTE — Telephone Encounter (Signed)
 Pt came in and dropped off Guardian Disability Form to be completed by Dr. Georgina.  Pt paid the $20.00 Form fee (via [place]). Pts guardian will call back with the fax # to fax the form out.  Placed form in tray for Tammy to complete

## 2023-09-18 NOTE — Telephone Encounter (Signed)
 Form given to Dr. Georgina

## 2023-09-24 ENCOUNTER — Encounter: Payer: Self-pay | Admitting: Orthopedic Surgery

## 2023-09-25 ENCOUNTER — Encounter: Payer: Self-pay | Admitting: Internal Medicine

## 2023-09-25 ENCOUNTER — Ambulatory Visit (INDEPENDENT_AMBULATORY_CARE_PROVIDER_SITE_OTHER): Admitting: Internal Medicine

## 2023-09-25 VITALS — BP 137/76 | HR 81 | Ht 71.0 in | Wt 160.8 lb

## 2023-09-25 DIAGNOSIS — M51362 Other intervertebral disc degeneration, lumbar region with discogenic back pain and lower extremity pain: Secondary | ICD-10-CM

## 2023-09-25 DIAGNOSIS — K858 Other acute pancreatitis without necrosis or infection: Secondary | ICD-10-CM

## 2023-09-25 DIAGNOSIS — I10 Essential (primary) hypertension: Secondary | ICD-10-CM | POA: Diagnosis not present

## 2023-09-25 DIAGNOSIS — K219 Gastro-esophageal reflux disease without esophagitis: Secondary | ICD-10-CM

## 2023-09-25 DIAGNOSIS — Z09 Encounter for follow-up examination after completed treatment for conditions other than malignant neoplasm: Secondary | ICD-10-CM | POA: Diagnosis not present

## 2023-09-25 NOTE — Patient Instructions (Signed)
 Please continue to take medications as prescribed.  Please continue to follow low salt diet and ambulate as tolerated.  Please remain abstinent from alcohol.

## 2023-09-25 NOTE — Progress Notes (Signed)
 Established Patient Office Visit  Subjective:  Patient ID: Colin Ellis, male    DOB: 08-Jun-1960  Age: 63 y.o. MRN: [id number]  CC:  Chief Complaint  Patient presents with   Hypertension    4 month f/u     HPI Colin Ellis is a 63 y.o. male with past medical history of HTN, COPD, BPH, pancreatitis and DDD of lumbar spine who presents for follow-up after recent hospitalization from 09/12/23-09/15/23.  He presented with abdominal pain over the last 3 to 4 days that was in his epigastric region and radiated to his left upper quadrant/back.  CT abdomen pelvis showed mild acute pancreatitis and hepatic steatosis.  He was admitted for further management.  He was given IV fluids and Protonix .  Diet was gradually advanced to solid food.  His lipase level and liver enzymes trended down during the hospitalization.  Today, he reports improvement in abdominal pain, nausea and vomiting.  He has tried to avoid alcohol use, last intake about 2 days ago - had 2 beers, but reports that he is trying to cut back and does not drink daily.  His BP is WNL today.  Past Medical History:  Diagnosis Date   ADH disorder    Arthritis    Electrical injury in adult    Electrician by trade, several shocks during working career    Psoriasis    Teeth missing due to trauma     Past Surgical History:  Procedure Laterality Date   BACK SURGERY     herniated disc   COLONOSCOPY  11/12/2010   Surgeon: Lamar CHRISTELLA Hollingshead, MD;  pancolonic diverticulosis, otherwise normal exam.   COLONOSCOPY WITH PROPOFOL  N/A 07/09/2021   Surgeon: Hollingshead Lamar CHRISTELLA, MD; pancolonic diverticulosis, redundant colon, otherwise normal exam.  Recommended 10-year screening.    Family History  Problem Relation Age of Onset   Colon cancer Paternal Grandfather        51s or 60s   Anesthesia problems Neg Hx    Hypotension Neg Hx    Malignant hyperthermia Neg Hx    Pseudochol deficiency Neg Hx     Social History   Socioeconomic  History   Marital status: Married    Spouse name: Not on file   Number of children: Not on file   Years of education: Not on file   Highest education level: GED or equivalent  Occupational History   Not on file  Tobacco Use   Smoking status: Every Day    Current packs/day: 1.00    Average packs/day: 1 pack/day for 40.0 years (40.0 ttl pk-yrs)    Types: Cigarettes   Smokeless tobacco: Never   Tobacco comments:    13 cigarettes a day as of 06/30/23  Vaping Use   Vaping status: Never Used  Substance and Sexual Activity   Alcohol use: Yes    Alcohol/week: 4.0 standard drinks of alcohol    Types: 4 Cans of beer per week    Comment: 3-4 beers a night   Drug use: No   Sexual activity: Not on file  Other Topics Concern   Not on file  Social History Narrative   Not on file   Social Drivers of Health   Financial Resource Strain: Medium Risk (07/22/2023)   Overall Financial Resource Strain (CARDIA)    Difficulty of Paying Living Expenses: Somewhat hard  Food Insecurity: No Food Insecurity (09/12/2023)   Hunger Vital Sign    Worried About Running Out of Food in the  Last Year: Never true    Ran Out of Food in the Last Year: Never true  Recent Concern: Food Insecurity - Food Insecurity Present (06/27/2023)   Hunger Vital Sign    Worried About Running Out of Food in the Last Year: Sometimes true    Ran Out of Food in the Last Year: Sometimes true  Transportation Needs: No Transportation Needs (09/12/2023)   PRAPARE - Administrator, Civil Service (Medical): No    Lack of Transportation (Non-Medical): No  Recent Concern: Transportation Needs - Unmet Transportation Needs (07/22/2023)   PRAPARE - Administrator, Civil Service (Medical): Yes    Lack of Transportation (Non-Medical): No  Physical Activity: Inactive (07/22/2023)   Exercise Vital Sign    Days of Exercise per Week: 0 days    Minutes of Exercise per Session: Not on file  Stress: Stress Concern Present  (07/22/2023)   [employer] of Occupational Health - Occupational Stress Questionnaire    Feeling of Stress: To some extent  Social Connections: Unknown (07/22/2023)   Social Connection and Isolation Panel    Frequency of Communication with Friends and Family: Once a week    Frequency of Social Gatherings with Friends and Family: Patient declined    Attends Religious Services: Never    Database administrator or Organizations: No    Attends Engineer, structural: Not on file    Marital Status: Married  Catering manager Violence: Not At Risk (09/12/2023)   Humiliation, Afraid, Rape, and Kick questionnaire    Fear of Current or Ex-Partner: No    Emotionally Abused: No    Physically Abused: No    Sexually Abused: No    Outpatient Medications Prior to Visit  Medication Sig Dispense Refill   baclofen  (LIORESAL ) 10 MG tablet Take 1 tablet (10 mg total) by mouth 2 (two) times daily. 60 each 2   clobetasol  ointment (TEMOVATE ) 0.05 % Apply 1 Application topically 2 (two) times daily. 60 g 1   Cyanocobalamin (B-12) 1000 MCG TBCR Take 1 tablet by mouth daily. 30 tablet 5   folic acid  (FOLVITE ) 1 MG tablet Take 1 tablet (1 mg total) by mouth daily. 30 tablet 5   gabapentin  (NEURONTIN ) 300 MG capsule Take 1 capsule (300 mg total) by mouth 3 (three) times daily. 90 capsule 5   losartan  (COZAAR ) 100 MG tablet Take 1 tablet (100 mg total) by mouth daily. 90 tablet 1   Melatonin 5 MG CAPS Take 3 each by mouth at bedtime.     Multiple Vitamins-Minerals (MULTIVITAMIN WITH MINERALS) tablet Take 1 tablet by mouth daily. 120 tablet 2   ondansetron  (ZOFRAN ) 4 MG tablet Take 1 tablet (4 mg total) by mouth every 6 (six) hours as needed for nausea. 20 tablet 0   pantoprazole  (PROTONIX ) 40 MG tablet Take 1 tablet (40 mg total) by mouth daily. 30 tablet 1   Thiamine  HCl (B-1) 100 MG TABS Take 1 tablet (100 mg total) by mouth daily. 30 tablet 5   No facility-administered medications prior to visit.     Allergies  Allergen Reactions   Sulfa Antibiotics Nausea And Vomiting    Upset stomach    ROS Review of Systems  Constitutional:  Positive for fatigue. Negative for chills and fever.  HENT:  Negative for congestion, sinus pressure and sinus pain.   Eyes:  Negative for pain and discharge.  Respiratory:  Negative for cough and shortness of breath.   Cardiovascular:  Negative  for chest pain and palpitations.  Gastrointestinal:  Negative for diarrhea, nausea and vomiting.  Genitourinary:  Negative for dysuria and hematuria.  Musculoskeletal:  Positive for arthralgias (Left clavicular and shoulder pain), back pain, gait problem and neck pain. Negative for neck stiffness.  Skin:  Positive for rash.  Neurological:  Negative for dizziness and weakness.  Psychiatric/Behavioral:  Positive for decreased concentration. Negative for agitation and confusion. The patient is nervous/anxious.       Objective:    Physical Exam Constitutional:      General: He is not in acute distress.    Appearance: He is not diaphoretic.  HENT:     Head: Normocephalic.     Nose: No congestion.     Mouth/Throat:     Mouth: Mucous membranes are moist.     Pharynx: No posterior oropharyngeal erythema.  Eyes:     General: No scleral icterus.    Extraocular Movements: Extraocular movements intact.  Cardiovascular:     Rate and Rhythm: Normal rate and regular rhythm.     Heart sounds: Normal heart sounds. No murmur heard. Pulmonary:     Breath sounds: Normal breath sounds. No wheezing or rales.  Abdominal:     Palpations: Abdomen is soft.     Tenderness: There is no abdominal tenderness.  Musculoskeletal:     Left shoulder: No swelling. Normal range of motion.     Cervical back: Neck supple. Tenderness present.     Lumbar back: Tenderness present. Decreased range of motion. Positive right straight leg raise test and positive left straight leg raise test.     Right lower leg: No edema.     Left lower  leg: No edema.     Comments: Left mid clavicular area tenderness  Skin:    General: Skin is warm.     Findings: Rash (Erythematous patches with silvery scaling over bilateral shin) present.  Neurological:     General: No focal deficit present.     Mental Status: He is alert and oriented to person, place, and time.     Sensory: No sensory deficit.     Motor: No weakness.  Psychiatric:        Mood and Affect: Mood is anxious.        Behavior: Behavior is cooperative.     BP 137/76   Pulse 81   Ht 5' 11 (1.803 m)   Wt 160 lb 12.8 oz (72.9 kg)   SpO2 98%   BMI 22.43 kg/m  Wt Readings from Last 3 Encounters:  09/25/23 160 lb 12.8 oz (72.9 kg)  09/17/23 150 lb (68 kg)  09/12/23 150 lb (68 kg)    Lab Results  Component Value Date   TSH 1.680 05/17/2022   Lab Results  Component Value Date   WBC 10.1 09/12/2023   HGB 16.9 09/12/2023   HCT 46.4 09/12/2023   MCV 91.7 09/12/2023   PLT 266 09/12/2023   Lab Results  Component Value Date   NA 138 09/14/2023   K 3.5 09/14/2023   CO2 25 09/14/2023   GLUCOSE 87 09/14/2023   BUN <5 (L) 09/14/2023   CREATININE 0.61 09/14/2023   BILITOT 1.1 09/14/2023   ALKPHOS 64 09/14/2023   AST 38 09/14/2023   ALT 33 09/14/2023   PROT 5.3 (L) 09/14/2023   ALBUMIN 2.9 (L) 09/14/2023   CALCIUM 8.2 (L) 09/14/2023   ANIONGAP 9 09/14/2023   EGFR 101 08/08/2023   Lab Results  Component Value Date   CHOL  153 05/17/2022   Lab Results  Component Value Date   HDL 83 05/17/2022   Lab Results  Component Value Date   LDLCALC 48 05/17/2022   Lab Results  Component Value Date   TRIG 128 05/17/2022   Lab Results  Component Value Date   CHOLHDL 1.8 05/17/2022   Lab Results  Component Value Date   HGBA1C 5.1 05/17/2022      Assessment & Plan:   Problem List Items Addressed This Visit       Cardiovascular and Mediastinum   Essential hypertension   BP Readings from Last 1 Encounters:  09/25/23 137/76   Well-controlled with  losartan  100 mg QD Needs to cut down on alcohol use and quit smoking Counseled for compliance with the medications Advised DASH diet and moderate exercise/walking, at least 150 mins/week        Digestive   Gastroesophageal reflux disease   Has longstanding history of alcohol abuse and NSAID use Recently had to take meloxicam  for acute low back pain and left hip pain, but stopped now Continue Protonix  40 mg QD Avoid hot and spicy food      Acute pancreatitis - Primary   Likely related to alcohol abuse Have had discussion to avoid alcohol use in the past due to pancreatitis He reports that he has cut back significantly and tries to avoid daily alcohol intake Advised to maintain adequate hydration Continue thiamine  and folic acid  supplements      Relevant Orders   CBC   CMP14+EGFR   Lipase     Musculoskeletal and Integument   Degeneration of intervertebral disc of lumbar region with discogenic back pain and lower extremity pain   Acute on chronic low back pain, likely due to heavy lifting at his workplace He has a history of sacral fracture and lumbar fusion surgery Continue gabapentin  300 mg 3 times daily Baclofen  as needed for muscle spasms Had spine surgery evaluation - had MRI of lumbar spine, reviewed Did not start physical therapy (referral was placed in the last visit) Simple back exercises material provided  Has been off of work since 05/25 - avoid prolonged standing and lifting more than 15 lbs        Other   Hospital discharge follow-up   Hospital chart reviewed, including discharge summary Medications reconciled and reviewed with the patient in detail      Relevant Orders   CBC   CMP14+EGFR    No orders of the defined types were placed in this encounter.   Follow-up: Return in about 5 months (around 02/25/2024) for HTN.    Suzzane MARLA Blanch, MD

## 2023-09-25 NOTE — Assessment & Plan Note (Signed)
 Has longstanding history of alcohol abuse and NSAID use Recently had to take meloxicam  for acute low back pain and left hip pain, but stopped now Continue Protonix  40 mg QD Avoid hot and spicy food

## 2023-09-25 NOTE — Assessment & Plan Note (Signed)
 Acute on chronic low back pain, likely due to heavy lifting at his workplace He has a history of sacral fracture and lumbar fusion surgery Continue gabapentin  300 mg 3 times daily Baclofen  as needed for muscle spasms Had spine surgery evaluation - had MRI of lumbar spine, reviewed Did not start physical therapy (referral was placed in the last visit) Simple back exercises material provided  Has been off of work since 05/25 - avoid prolonged standing and lifting more than 15 lbs

## 2023-09-25 NOTE — Assessment & Plan Note (Signed)
 BP Readings from Last 1 Encounters:  09/25/23 137/76   Well-controlled with losartan  100 mg QD Needs to cut down on alcohol use and quit smoking Counseled for compliance with the medications Advised DASH diet and moderate exercise/walking, at least 150 mins/week

## 2023-09-25 NOTE — Assessment & Plan Note (Signed)
 Hospital chart reviewed, including discharge summary Medications reconciled and reviewed with the patient in detail

## 2023-09-25 NOTE — Assessment & Plan Note (Signed)
 Likely related to alcohol abuse Have had discussion to avoid alcohol use in the past due to pancreatitis He reports that he has cut back significantly and tries to avoid daily alcohol intake Advised to maintain adequate hydration Continue thiamine  and folic acid  supplements

## 2023-09-26 ENCOUNTER — Encounter: Payer: Self-pay | Admitting: Internal Medicine

## 2023-09-26 ENCOUNTER — Ambulatory Visit: Payer: Self-pay | Admitting: Internal Medicine

## 2023-09-26 LAB — CMP14+EGFR
ALT: 34 IU/L (ref 0–44)
AST: 40 IU/L (ref 0–40)
Albumin: 4.1 g/dL (ref 3.9–4.9)
Alkaline Phosphatase: 91 IU/L (ref 44–121)
BUN/Creatinine Ratio: 6 — ABNORMAL LOW (ref 10–24)
BUN: 5 mg/dL — ABNORMAL LOW (ref 8–27)
Bilirubin Total: 0.3 mg/dL (ref 0.0–1.2)
CO2: 21 mmol/L (ref 20–29)
Calcium: 9.4 mg/dL (ref 8.6–10.2)
Chloride: 102 mmol/L (ref 96–106)
Creatinine, Ser: 0.78 mg/dL (ref 0.76–1.27)
Globulin, Total: 2.4 g/dL (ref 1.5–4.5)
Glucose: 76 mg/dL (ref 70–99)
Potassium: 4.9 mmol/L (ref 3.5–5.2)
Sodium: 139 mmol/L (ref 134–144)
Total Protein: 6.5 g/dL (ref 6.0–8.5)
eGFR: 100 mL/min/1.73 (ref >59)

## 2023-09-26 LAB — CBC
Hematocrit: 44 % (ref 37.5–51.0)
Hemoglobin: 14.6 g/dL (ref 13.0–17.7)
MCH: 33.7 pg — ABNORMAL HIGH (ref 26.6–33.0)
MCHC: 33.2 g/dL (ref 31.5–35.7)
MCV: 102 fL — ABNORMAL HIGH (ref 79–97)
Platelets: 376 x10E3/uL (ref 150–450)
RBC: 4.33 x10E6/uL (ref 4.14–5.80)
RDW: 11.6 % (ref 11.6–15.4)
WBC: 7.5 x10E3/uL (ref 3.4–10.8)

## 2023-09-26 LAB — LIPASE: Lipase: 634 U/L — ABNORMAL HIGH (ref 13–78)

## 2023-10-03 ENCOUNTER — Ambulatory Visit: Admitting: Internal Medicine

## 2023-10-10 ENCOUNTER — Encounter: Payer: Self-pay | Admitting: Internal Medicine

## 2023-10-13 NOTE — Telephone Encounter (Signed)
 Appt scheduled on 10/27/23 at 2:40pm, pt made aware.

## 2023-10-15 ENCOUNTER — Other Ambulatory Visit: Payer: Self-pay | Admitting: Internal Medicine

## 2023-10-15 DIAGNOSIS — M51362 Other intervertebral disc degeneration, lumbar region with discogenic back pain and lower extremity pain: Secondary | ICD-10-CM

## 2023-10-15 DIAGNOSIS — M5030 Other cervical disc degeneration, unspecified cervical region: Secondary | ICD-10-CM

## 2023-10-27 ENCOUNTER — Encounter: Payer: Self-pay | Admitting: Internal Medicine

## 2023-10-27 ENCOUNTER — Ambulatory Visit (INDEPENDENT_AMBULATORY_CARE_PROVIDER_SITE_OTHER): Admitting: Internal Medicine

## 2023-10-27 VITALS — BP 123/76 | HR 77 | Ht 71.0 in | Wt 163.8 lb

## 2023-10-27 DIAGNOSIS — F1029 Alcohol dependence with unspecified alcohol-induced disorder: Secondary | ICD-10-CM | POA: Diagnosis not present

## 2023-10-27 DIAGNOSIS — F331 Major depressive disorder, recurrent, moderate: Secondary | ICD-10-CM | POA: Diagnosis not present

## 2023-10-27 DIAGNOSIS — M51362 Other intervertebral disc degeneration, lumbar region with discogenic back pain and lower extremity pain: Secondary | ICD-10-CM | POA: Diagnosis not present

## 2023-10-27 DIAGNOSIS — F411 Generalized anxiety disorder: Secondary | ICD-10-CM | POA: Diagnosis not present

## 2023-10-27 MED ORDER — BUSPIRONE HCL 7.5 MG PO TABS
7.5000 mg | ORAL_TABLET | Freq: Two times a day (BID) | ORAL | 3 refills | Status: AC
Start: 2023-10-27 — End: ?

## 2023-10-27 NOTE — Assessment & Plan Note (Signed)
 Takes at least 8-9 beers every day, likely a factor for his GAD and MDD Has had acute pancreatitis related hospitalization recently Advised to cut down and later avoid drinking Advised to contact [facility] for de-addiction program

## 2023-10-27 NOTE — Progress Notes (Signed)
 Acute Office Visit  Subjective:    Patient ID: Colin Ellis, male    DOB: 01-30-1960, 63 y.o.   MRN: [id number]  Chief Complaint  Patient presents with   Medical Management of Chronic Issues    2 Week f/u per my chart message.     HPI Patient is in today for complaint of recent worsening of his anxiety and depression.  He has had anger bursts at times.  He keeps watching provocative shows on news channel and gets upset with people. His wife has reported that he has been verbally abusive towards her at times.  He attributes his current symptoms to boredom.  He has started drinking alcohol again despite having recent hospitalization for pancreatitis.  He reports drinking 8-9 beers in a day recently.  He understands that alcohol is contributing to abdominal pain and other physical problems in addition to worsening his anxiety.  He is also worried about his cognitive decline.  His MoCA was 24/30 in 05/25.  He has not returned to work since 05/25 and is planning to retire soon.  Past Medical History:  Diagnosis Date   ADH disorder    Arthritis    Electrical injury in adult    Electrician by trade, several shocks during working career    Psoriasis    Teeth missing due to trauma     Past Surgical History:  Procedure Laterality Date   BACK SURGERY     herniated disc   COLONOSCOPY  11/12/2010   Surgeon: Lamar CHRISTELLA Hollingshead, MD;  pancolonic diverticulosis, otherwise normal exam.   COLONOSCOPY WITH PROPOFOL  N/A 07/09/2021   Surgeon: Hollingshead Lamar CHRISTELLA, MD; pancolonic diverticulosis, redundant colon, otherwise normal exam.  Recommended 10-year screening.    Family History  Problem Relation Age of Onset   Colon cancer Paternal Grandfather        71s or 24s   Anesthesia problems Neg Hx    Hypotension Neg Hx    Malignant hyperthermia Neg Hx    Pseudochol deficiency Neg Hx     Social History   Socioeconomic History   Marital status: Married    Spouse name: Not on file   Number of  children: Not on file   Years of education: Not on file   Highest education level: GED or equivalent  Occupational History   Not on file  Tobacco Use   Smoking status: Every Day    Current packs/day: 1.00    Average packs/day: 1 pack/day for 40.0 years (40.0 ttl pk-yrs)    Types: Cigarettes   Smokeless tobacco: Never   Tobacco comments:    13 cigarettes a day as of 06/30/23  Vaping Use   Vaping status: Never Used  Substance and Sexual Activity   Alcohol use: Yes    Alcohol/week: 4.0 standard drinks of alcohol    Types: 4 Cans of beer per week    Comment: 3-4 beers a night   Drug use: No   Sexual activity: Not on file  Other Topics Concern   Not on file  Social History Narrative   Not on file   Social Drivers of Health   Financial Resource Strain: Medium Risk (07/22/2023)   Overall Financial Resource Strain (CARDIA)    Difficulty of Paying Living Expenses: Somewhat hard  Food Insecurity: No Food Insecurity (09/12/2023)   Hunger Vital Sign    Worried About Running Out of Food in the Last Year: Never true    Ran Out of Food in the  Last Year: Never true  Recent Concern: Food Insecurity - Food Insecurity Present (06/27/2023)   Hunger Vital Sign    Worried About Running Out of Food in the Last Year: Sometimes true    Ran Out of Food in the Last Year: Sometimes true  Transportation Needs: No Transportation Needs (09/12/2023)   PRAPARE - Administrator, Civil Service (Medical): No    Lack of Transportation (Non-Medical): No  Recent Concern: Transportation Needs - Unmet Transportation Needs (07/22/2023)   PRAPARE - Administrator, Civil Service (Medical): Yes    Lack of Transportation (Non-Medical): No  Physical Activity: Inactive (07/22/2023)   Exercise Vital Sign    Days of Exercise per Week: 0 days    Minutes of Exercise per Session: Not on file  Stress: Stress Concern Present (07/22/2023)   [employer] of Occupational Health - Occupational Stress  Questionnaire    Feeling of Stress: To some extent  Social Connections: Unknown (07/22/2023)   Social Connection and Isolation Panel    Frequency of Communication with Friends and Family: Once a week    Frequency of Social Gatherings with Friends and Family: Patient declined    Attends Religious Services: Never    Database administrator or Organizations: No    Attends Engineer, structural: Not on file    Marital Status: Married  Catering manager Violence: Not At Risk (09/12/2023)   Humiliation, Afraid, Rape, and Kick questionnaire    Fear of Current or Ex-Partner: No    Emotionally Abused: No    Physically Abused: No    Sexually Abused: No    Outpatient Medications Prior to Visit  Medication Sig Dispense Refill   baclofen  (LIORESAL ) 10 MG tablet TAKE 1 TABLET(10 MG) BY MOUTH TWICE DAILY 60 tablet 0   clobetasol  ointment (TEMOVATE ) 0.05 % Apply 1 Application topically 2 (two) times daily. 60 g 1   Cyanocobalamin (B-12) 1000 MCG TBCR Take 1 tablet by mouth daily. 30 tablet 5   folic acid  (FOLVITE ) 1 MG tablet Take 1 tablet (1 mg total) by mouth daily. 30 tablet 5   gabapentin  (NEURONTIN ) 300 MG capsule Take 1 capsule (300 mg total) by mouth 3 (three) times daily. 90 capsule 5   losartan  (COZAAR ) 100 MG tablet Take 1 tablet (100 mg total) by mouth daily. 90 tablet 1   Melatonin 5 MG CAPS Take 3 each by mouth at bedtime.     Multiple Vitamins-Minerals (MULTIVITAMIN WITH MINERALS) tablet Take 1 tablet by mouth daily. 120 tablet 2   pantoprazole  (PROTONIX ) 40 MG tablet Take 1 tablet (40 mg total) by mouth daily. 30 tablet 1   Thiamine  HCl (B-1) 100 MG TABS Take 1 tablet (100 mg total) by mouth daily. 30 tablet 5   ondansetron  (ZOFRAN ) 4 MG tablet Take 1 tablet (4 mg total) by mouth every 6 (six) hours as needed for nausea. 20 tablet 0   No facility-administered medications prior to visit.    Allergies  Allergen Reactions   Sulfa Antibiotics Nausea And Vomiting    Upset stomach     Review of Systems  Constitutional:  Positive for fatigue. Negative for chills and fever.  HENT:  Negative for congestion, sinus pressure and sinus pain.   Eyes:  Negative for pain and discharge.  Respiratory:  Negative for cough and shortness of breath.   Cardiovascular:  Negative for chest pain and palpitations.  Gastrointestinal:  Negative for diarrhea, nausea and vomiting.  Genitourinary:  Negative  for dysuria and hematuria.  Musculoskeletal:  Positive for arthralgias (Left clavicular and shoulder pain), back pain, gait problem and neck pain. Negative for neck stiffness.  Skin:  Positive for rash.  Neurological:  Negative for dizziness and weakness.  Psychiatric/Behavioral:  Positive for agitation, decreased concentration and dysphoric mood. Negative for confusion. The patient is nervous/anxious.        Objective:    Physical Exam Constitutional:      General: He is not in acute distress.    Appearance: He is not diaphoretic.  HENT:     Head: Normocephalic.     Nose: No congestion.     Mouth/Throat:     Mouth: Mucous membranes are moist.     Pharynx: No posterior oropharyngeal erythema.  Eyes:     General: No scleral icterus.    Extraocular Movements: Extraocular movements intact.  Cardiovascular:     Rate and Rhythm: Normal rate and regular rhythm.     Heart sounds: Normal heart sounds. No murmur heard. Pulmonary:     Breath sounds: Normal breath sounds. No wheezing or rales.  Musculoskeletal:     Left shoulder: No swelling. Normal range of motion.     Cervical back: Neck supple. Tenderness present.     Lumbar back: Tenderness present. Decreased range of motion. Positive right straight leg raise test and positive left straight leg raise test.     Right lower leg: No edema.     Left lower leg: No edema.     Comments: Left mid clavicular area tenderness  Skin:    General: Skin is warm.     Findings: Rash (Erythematous patches with silvery scaling over bilateral  shin) present.  Neurological:     General: No focal deficit present.     Mental Status: He is alert and oriented to person, place, and time.     Sensory: No sensory deficit.     Motor: No weakness.  Psychiatric:        Mood and Affect: Mood is anxious.        Behavior: Behavior is cooperative.     BP 123/76   Pulse 77   Ht 5' 11 (1.803 m)   Wt 163 lb 12.8 oz (74.3 kg)   SpO2 98%   BMI 22.85 kg/m  Wt Readings from Last 3 Encounters:  10/27/23 163 lb 12.8 oz (74.3 kg)  09/25/23 160 lb 12.8 oz (72.9 kg)  09/17/23 150 lb (68 kg)        Assessment & Plan:   Problem List Items Addressed This Visit       Musculoskeletal and Integument   Degeneration of intervertebral disc of lumbar region with discogenic back pain and lower extremity pain   Chronic low back pain, likely due to heavy lifting at his workplace He has a history of sacral fracture and lumbar fusion surgery Continue gabapentin  300 mg 3 times daily Baclofen  as needed for muscle spasms Had spine surgery evaluation - had MRI of lumbar spine, reviewed Did not start physical therapy (referral was placed in the last visit) Simple back exercises material provided  Has been off of work since 05/25 - avoid prolonged standing and lifting more than 15 lbs        Other   Alcohol dependence with unspecified alcohol-induced disorder (HCC)   Takes at least 8-9 beers every day, likely a factor for his GAD and MDD Has had acute pancreatitis related hospitalization recently Advised to cut down and later avoid drinking Advised  to contact [facility] for de-addiction program      Moderate episode of recurrent major depressive disorder (HCC) - Primary   Uncontrolled, has mostly anxiety and anger bursts He attributes feeling depressed due to boredom Started buspirone 7.5 mg BID, can adjust dose according to response If persistent or worsening symptoms, will add SSRI Advised to engage in activities of liking and perform  relaxation exercises      Relevant Medications   busPIRone (BUSPAR) 7.5 MG tablet   GAD (generalized anxiety disorder)   Uncontrolled currently Has agitation and anger outbursts Needs to cut down alcohol intake Started buspirone 7.5 mg BID, can adjust dose according to response If persistent or worsening symptoms, will add SSRI      Relevant Medications   busPIRone (BUSPAR) 7.5 MG tablet     Meds ordered this encounter  Medications   busPIRone (BUSPAR) 7.5 MG tablet    Sig: Take 1 tablet (7.5 mg total) by mouth 2 (two) times daily.    Dispense:  60 tablet    Refill:  3     Tremond Shimabukuro MARLA Blanch, MD

## 2023-10-27 NOTE — Patient Instructions (Addendum)
 Please start taking Buspirone as prescribed.  Please cut down -> quit alcohol use.  Please contact [facility] for alcohol cessation.  [address], [place], [place] [address] Phone: [phone]

## 2023-10-27 NOTE — Assessment & Plan Note (Signed)
 Uncontrolled, has mostly anxiety and anger bursts He attributes feeling depressed due to boredom Started buspirone 7.5 mg BID, can adjust dose according to response If persistent or worsening symptoms, will add SSRI Advised to engage in activities of liking and perform relaxation exercises

## 2023-10-27 NOTE — Assessment & Plan Note (Signed)
 Uncontrolled currently Has agitation and anger outbursts Needs to cut down alcohol intake Started buspirone 7.5 mg BID, can adjust dose according to response If persistent or worsening symptoms, will add SSRI

## 2023-10-27 NOTE — Assessment & Plan Note (Signed)
 Chronic low back pain, likely due to heavy lifting at his workplace He has a history of sacral fracture and lumbar fusion surgery Continue gabapentin  300 mg 3 times daily Baclofen  as needed for muscle spasms Had spine surgery evaluation - had MRI of lumbar spine, reviewed Did not start physical therapy (referral was placed in the last visit) Simple back exercises material provided  Has been off of work since 05/25 - avoid prolonged standing and lifting more than 15 lbs

## 2023-11-03 ENCOUNTER — Encounter: Payer: Self-pay | Admitting: Internal Medicine

## 2023-11-04 NOTE — Telephone Encounter (Signed)
 Spoke with Colin Ellis.Pt scheduled for 10/15 at 9:40

## 2023-11-04 NOTE — Telephone Encounter (Signed)
 Thursday 10/16

## 2023-11-06 ENCOUNTER — Encounter: Payer: Self-pay | Admitting: Internal Medicine

## 2023-11-06 ENCOUNTER — Ambulatory Visit (INDEPENDENT_AMBULATORY_CARE_PROVIDER_SITE_OTHER): Admitting: Internal Medicine

## 2023-11-06 VITALS — BP 138/86 | HR 106 | Ht 71.0 in | Wt 164.0 lb

## 2023-11-06 DIAGNOSIS — F411 Generalized anxiety disorder: Secondary | ICD-10-CM | POA: Diagnosis not present

## 2023-11-06 DIAGNOSIS — F331 Major depressive disorder, recurrent, moderate: Secondary | ICD-10-CM | POA: Diagnosis not present

## 2023-11-06 DIAGNOSIS — R4189 Other symptoms and signs involving cognitive functions and awareness: Secondary | ICD-10-CM | POA: Diagnosis not present

## 2023-11-06 MED ORDER — HYDROXYZINE PAMOATE 25 MG PO CAPS: 25.0000 mg | ORAL_CAPSULE | Freq: Three times a day (TID) | ORAL | 1 refills | Status: DC | PRN

## 2023-11-06 NOTE — Patient Instructions (Addendum)
 Please take Hydroxyzine as needed for anxiety/agitation.  Please try to cut down -> quit alcohol use.  You are being referred to Neurology.

## 2023-11-06 NOTE — Assessment & Plan Note (Addendum)
 Uncontrolled, has mostly anxiety and anger bursts He attributes feeling depressed due to boredom Recently started buspirone 7.5 mg BID, can adjust dose according to response If persistent or worsening symptoms, will add Seroquel Advised to engage in activities of liking and perform relaxation exercises

## 2023-11-06 NOTE — Progress Notes (Signed)
 Acute Office Visit  Subjective:    Patient ID: Colin Ellis, male    DOB: [birth year], 63 y.o.   MRN: [id number]  Chief Complaint  Patient presents with   Mental Health Problem    Mental Health Consultation     HPI Patient is in today for complaint of recent worsening of his anxiety and depression.  He was recently seen on 10/27/23.  His wife is present during the visit today to provide more information about his recent behavioral changes.  She believes that his cognition is declining and has been forgetting things very easily.  He used to keep his workplace neat, but has been messy lately, and does not clean it regularly now.  He has had anger bursts at times.  He keeps watching provocative shows on news channel and gets upset with people. His wife has reported that he has been verbally abusive towards her at times.  He attributed his current symptoms to boredom in the last visit.  He has started cutting down alcohol in the last 10 days, had recent hospitalization for pancreatitis.  He reports drinking about 2 beers in a day recently.  He understands that alcohol is contributing to abdominal pain and other physical problems in addition to worsening his anxiety.  He is also worried about his cognitive decline.  His MoCA was 24/30 in 05/25.  He still has chronic low back pain, which exacerbates at times with certain movement.  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  He has not returned to work since 05/25 and is planning to retire soon.  Past Medical History:  Diagnosis Date   ADH disorder    Arthritis    Electrical injury in adult    Electrician by trade, several shocks during working career    Psoriasis    Teeth missing due to trauma     Past Surgical History:  Procedure Laterality Date   BACK SURGERY     herniated disc   COLONOSCOPY   11/12/2010   Surgeon: Lamar CHRISTELLA Hollingshead, MD;  pancolonic diverticulosis, otherwise normal exam.   COLONOSCOPY WITH PROPOFOL  N/A 07/09/2021   Surgeon: Hollingshead Lamar CHRISTELLA, MD; pancolonic diverticulosis, redundant colon, otherwise normal exam.  Recommended 10-year screening.    Family History  Problem Relation Age of Onset   Colon cancer Paternal Grandfather        16s or 57s   Anesthesia problems Neg Hx    Hypotension Neg Hx    Malignant hyperthermia Neg Hx    Pseudochol deficiency Neg Hx     Social History   Socioeconomic History   Marital status: Married    Spouse name: Not on file   Number of children: Not on file   Years of education: Not on file   Highest education level: GED or equivalent  Occupational History   Not on file  Tobacco Use   Smoking status: Every Day    Current packs/day: 1.00    Average packs/day: 1 pack/day for 40.0 years (40.0 ttl pk-yrs)    Types: Cigarettes   Smokeless tobacco: Never   Tobacco comments:    13 cigarettes a day as of 06/30/23  Vaping Use   Vaping status: Never Used  Substance and Sexual Activity   Alcohol use: Yes    Alcohol/week: 4.0 standard drinks of alcohol  Types: 4 Cans of beer per week    Comment: 3-4 beers a night   Drug use: No   Sexual activity: Not on file  Other Topics Concern   Not on file  Social History Narrative   Not on file   Social Drivers of Health   Financial Resource Strain: Medium Risk (07/22/2023)   Overall Financial Resource Strain (CARDIA)    Difficulty of Paying Living Expenses: Somewhat hard  Food Insecurity: No Food Insecurity (09/12/2023)   Hunger Vital Sign    Worried About Running Out of Food in the Last Year: Never true    Ran Out of Food in the Last Year: Never true  Recent Concern: Food Insecurity - Food Insecurity Present (06/27/2023)   Hunger Vital Sign    Worried About Running Out of Food in the Last Year: Sometimes true    Ran Out of Food in the Last Year: Sometimes true  Transportation  Needs: No Transportation Needs (09/12/2023)   PRAPARE - Administrator, Civil Service (Medical): No    Lack of Transportation (Non-Medical): No  Recent Concern: Transportation Needs - Unmet Transportation Needs (07/22/2023)   PRAPARE - Administrator, Civil Service (Medical): Yes    Lack of Transportation (Non-Medical): No  Physical Activity: Inactive (07/22/2023)   Exercise Vital Sign    Days of Exercise per Week: 0 days    Minutes of Exercise per Session: Not on file  Stress: Stress Concern Present (07/22/2023)   [employer] of Occupational Health - Occupational Stress Questionnaire    Feeling of Stress: To some extent  Social Connections: Unknown (07/22/2023)   Social Connection and Isolation Panel    Frequency of Communication with Friends and Family: Once a week    Frequency of Social Gatherings with Friends and Family: Patient declined    Attends Religious Services: Never    Database administrator or Organizations: No    Attends Engineer, structural: Not on file    Marital Status: Married  Catering manager Violence: Not At Risk (09/12/2023)   Humiliation, Afraid, Rape, and Kick questionnaire    Fear of Current or Ex-Partner: No    Emotionally Abused: No    Physically Abused: No    Sexually Abused: No    Outpatient Medications Prior to Visit  Medication Sig Dispense Refill   baclofen  (LIORESAL ) 10 MG tablet TAKE 1 TABLET(10 MG) BY MOUTH TWICE DAILY 60 tablet 0   busPIRone (BUSPAR) 7.5 MG tablet Take 1 tablet (7.5 mg total) by mouth 2 (two) times daily. 60 tablet 3   clobetasol  ointment (TEMOVATE ) 0.05 % Apply 1 Application topically 2 (two) times daily. 60 g 1   Cyanocobalamin (B-12) 1000 MCG TBCR Take 1 tablet by mouth daily. 30 tablet 5   folic acid  (FOLVITE ) 1 MG tablet Take 1 tablet (1 mg total) by mouth daily. 30 tablet 5   gabapentin  (NEURONTIN ) 300 MG capsule Take 1 capsule (300 mg total) by mouth 3 (three) times daily. 90 capsule 5    losartan  (COZAAR ) 100 MG tablet Take 1 tablet (100 mg total) by mouth daily. 90 tablet 1   Melatonin 5 MG CAPS Take 3 each by mouth at bedtime.     Multiple Vitamins-Minerals (MULTIVITAMIN WITH MINERALS) tablet Take 1 tablet by mouth daily. 120 tablet 2   pantoprazole  (PROTONIX ) 40 MG tablet Take 1 tablet (40 mg total) by mouth daily. 30 tablet 1   Thiamine  HCl (B-1) 100 MG TABS Take  1 tablet (100 mg total) by mouth daily. 30 tablet 5   No facility-administered medications prior to visit.    Allergies  Allergen Reactions   Sulfa Antibiotics Nausea And Vomiting    Upset stomach    Review of Systems  Constitutional:  Positive for fatigue. Negative for chills and fever.  HENT:  Negative for congestion, sinus pressure and sinus pain.   Eyes:  Negative for pain and discharge.  Respiratory:  Negative for cough and shortness of breath.   Cardiovascular:  Negative for chest pain and palpitations.  Gastrointestinal:  Negative for diarrhea, nausea and vomiting.  Genitourinary:  Negative for dysuria and hematuria.  Musculoskeletal:  Positive for arthralgias (Left clavicular and shoulder pain), back pain, gait problem and neck pain. Negative for neck stiffness.  Skin:  Positive for rash.  Neurological:  Negative for dizziness and weakness.  Psychiatric/Behavioral:  Positive for agitation, decreased concentration and dysphoric mood. Negative for confusion. The patient is nervous/anxious.        Objective:    Physical Exam Constitutional:      General: He is not in acute distress.    Appearance: He is not diaphoretic.  HENT:     Head: Normocephalic.     Nose: No congestion.     Mouth/Throat:     Mouth: Mucous membranes are moist.     Pharynx: No posterior oropharyngeal erythema.  Eyes:     General: No scleral icterus.    Extraocular Movements: Extraocular movements intact.  Cardiovascular:     Rate and Rhythm: Normal rate and regular rhythm.     Heart sounds: Normal heart sounds. No  murmur heard. Pulmonary:     Breath sounds: Normal breath sounds. No wheezing or rales.  Musculoskeletal:     Left shoulder: No swelling. Normal range of motion.     Cervical back: Neck supple. Tenderness present.     Lumbar back: Tenderness present. Decreased range of motion. Positive right straight leg raise test and positive left straight leg raise test.     Right lower leg: No edema.     Left lower leg: No edema.     Comments: Left mid clavicular area tenderness  Skin:    General: Skin is warm.     Findings: Rash (Erythematous patches with silvery scaling over bilateral shin) present.  Neurological:     General: No focal deficit present.     Mental Status: He is alert and oriented to person, place, and time.     Sensory: No sensory deficit.     Motor: No weakness.  Psychiatric:        Mood and Affect: Mood is anxious.        Behavior: Behavior is cooperative.     BP 138/86 (BP Location: Left Arm)   Pulse (!) 106   Ht 5' 11 (1.803 m)   Wt 164 lb (74.4 kg)   SpO2 98%   BMI 22.87 kg/m  Wt Readings from Last 3 Encounters:  11/06/23 164 lb (74.4 kg)  10/27/23 163 lb 12.8 oz (74.3 kg)  09/25/23 160 lb 12.8 oz (72.9 kg)        Assessment & Plan:   Problem List Items Addressed This Visit       Other   Cognitive decline - Primary   MoCA: 24/30 in 05/25 Likely has impact of daily alcohol intake, has been repeatedly advised to avoid alcohol on daily basis and binge drinking, has cut down alcohol now, but needs to quit Needs  to improve sleep hygiene Maintain adequate hydration and eat at regular intervals Advised to take vitamin B1 and B12 supplements Reports family history of early onset dementia Referred to neurology for further evaluation      Relevant Orders   Ambulatory referral to Neurology   GAD (generalized anxiety disorder)   Uncontrolled currently Has agitation and anger outbursts Needs to cut down alcohol intake Recently started buspirone 7.5 mg BID,  can adjust dose according to response Added hydroxyzine as needed for anxiety If persistent or worsening symptoms, will add Seroquel Was on BZD and Adderall in the past (was started by Dr. Rennis)      Relevant Medications   hydrOXYzine (VISTARIL) 25 MG capsule      Meds ordered this encounter  Medications   hydrOXYzine (VISTARIL) 25 MG capsule    Sig: Take 1 capsule (25 mg total) by mouth every 8 (eight) hours as needed.    Dispense:  90 capsule    Refill:  1     Cleavon Goldman MARLA Blanch, MD

## 2023-11-06 NOTE — Assessment & Plan Note (Addendum)
 MoCA: 24/30 in 05/25 Likely has impact of daily alcohol intake, has been repeatedly advised to avoid alcohol on daily basis and binge drinking, has cut down alcohol now, but needs to quit Needs to improve sleep hygiene Maintain adequate hydration and eat at regular intervals Advised to take vitamin B1 and B12 supplements Reports family history of early onset dementia Referred to neurology for further evaluation

## 2023-11-06 NOTE — Assessment & Plan Note (Signed)
 Uncontrolled currently Has agitation and anger outbursts Needs to cut down alcohol intake Recently started buspirone 7.5 mg BID, can adjust dose according to response Added hydroxyzine as needed for anxiety If persistent or worsening symptoms, will add Seroquel Was on BZD and Adderall in the past (was started by Dr. Rennis)

## 2023-11-19 ENCOUNTER — Emergency Department (HOSPITAL_COMMUNITY): Payer: Self-pay

## 2023-11-19 ENCOUNTER — Inpatient Hospital Stay (HOSPITAL_COMMUNITY)
Admission: EM | Admit: 2023-11-19 | Discharge: 2023-11-21 | DRG: 439 | Disposition: A | Discharge status: Home / Self Care | Payer: Self-pay | Attending: Hospitalist | Admitting: Hospitalist

## 2023-11-19 ENCOUNTER — Encounter (HOSPITAL_COMMUNITY): Payer: Self-pay

## 2023-11-19 ENCOUNTER — Other Ambulatory Visit: Payer: Self-pay

## 2023-11-19 DIAGNOSIS — I10 Essential (primary) hypertension: Secondary | ICD-10-CM | POA: Diagnosis not present

## 2023-11-19 DIAGNOSIS — Z79890 Hormone replacement therapy (postmenopausal): Secondary | ICD-10-CM

## 2023-11-19 DIAGNOSIS — N400 Enlarged prostate without lower urinary tract symptoms: Secondary | ICD-10-CM | POA: Diagnosis present

## 2023-11-19 DIAGNOSIS — K709 Alcoholic liver disease, unspecified: Secondary | ICD-10-CM | POA: Diagnosis present

## 2023-11-19 DIAGNOSIS — Z79899 Other long term (current) drug therapy: Secondary | ICD-10-CM

## 2023-11-19 DIAGNOSIS — E872 Acidosis, unspecified: Secondary | ICD-10-CM | POA: Diagnosis present

## 2023-11-19 DIAGNOSIS — F17210 Nicotine dependence, cigarettes, uncomplicated: Secondary | ICD-10-CM | POA: Diagnosis present

## 2023-11-19 DIAGNOSIS — J449 Chronic obstructive pulmonary disease, unspecified: Secondary | ICD-10-CM | POA: Diagnosis not present

## 2023-11-19 DIAGNOSIS — K760 Fatty (change of) liver, not elsewhere classified: Secondary | ICD-10-CM | POA: Diagnosis present

## 2023-11-19 DIAGNOSIS — Z882 Allergy status to sulfonamides status: Secondary | ICD-10-CM

## 2023-11-19 DIAGNOSIS — R748 Abnormal levels of other serum enzymes: Secondary | ICD-10-CM

## 2023-11-19 DIAGNOSIS — K852 Alcohol induced acute pancreatitis without necrosis or infection: Secondary | ICD-10-CM | POA: Diagnosis not present

## 2023-11-19 DIAGNOSIS — F1010 Alcohol abuse, uncomplicated: Secondary | ICD-10-CM | POA: Diagnosis present

## 2023-11-19 DIAGNOSIS — Z800 Family history of malignant neoplasm of digestive organs: Secondary | ICD-10-CM

## 2023-11-19 LAB — CBC WITH DIFFERENTIAL/PLATELET
Abs Immature Granulocytes: 0.04 K/uL (ref 0.00–0.07)
Basophils Absolute: 0.1 K/uL (ref 0.0–0.1)
Basophils Relative: 1 %
Eosinophils Absolute: 0.1 K/uL (ref 0.0–0.5)
Eosinophils Relative: 1 %
HCT: 47.5 % (ref 39.0–52.0)
Hemoglobin: 16.7 g/dL (ref 13.0–17.0)
Immature Granulocytes: 0 %
Lymphocytes Relative: 6 %
Lymphs Abs: 0.7 K/uL (ref 0.7–4.0)
MCH: 33.2 pg (ref 26.0–34.0)
MCHC: 35.2 g/dL (ref 30.0–36.0)
MCV: 94.4 fL (ref 80.0–100.0)
Monocytes Absolute: 0.8 K/uL (ref 0.1–1.0)
Monocytes Relative: 7 %
Neutro Abs: 10.1 K/uL — ABNORMAL HIGH (ref 1.7–7.7)
Neutrophils Relative %: 85 %
Platelets: 250 K/uL (ref 150–400)
RBC: 5.03 MIL/uL (ref 4.22–5.81)
RDW: 12.8 % (ref 11.5–15.5)
WBC: 11.8 K/uL — ABNORMAL HIGH (ref 4.0–10.5)
nRBC: 0 % (ref 0.0–0.2)

## 2023-11-19 LAB — URINALYSIS, ROUTINE W REFLEX MICROSCOPIC
Bilirubin Urine: NEGATIVE
Glucose, UA: NEGATIVE mg/dL
Hgb urine dipstick: NEGATIVE
Ketones, ur: 20 mg/dL — AB
Leukocytes,Ua: NEGATIVE
Nitrite: NEGATIVE
Protein, ur: NEGATIVE mg/dL
Specific Gravity, Urine: 1.018 (ref 1.005–1.030)
pH: 5 (ref 5.0–8.0)

## 2023-11-19 LAB — COMPREHENSIVE METABOLIC PANEL WITH GFR
ALT: 80 U/L — ABNORMAL HIGH (ref 0–44)
AST: 80 U/L — ABNORMAL HIGH (ref 15–41)
Albumin: 5 g/dL (ref 3.5–5.0)
Alkaline Phosphatase: 109 U/L (ref 38–126)
Anion gap: 24 — ABNORMAL HIGH (ref 5–15)
BUN: 7 mg/dL — ABNORMAL LOW (ref 8–23)
CO2: 17 mmol/L — ABNORMAL LOW (ref 22–32)
Calcium: 9.7 mg/dL (ref 8.9–10.3)
Chloride: 90 mmol/L — ABNORMAL LOW (ref 98–111)
Creatinine, Ser: 0.73 mg/dL (ref 0.61–1.24)
GFR, Estimated: >60 mL/min (ref >60)
Glucose, Bld: 86 mg/dL (ref 70–99)
Potassium: 4 mmol/L (ref 3.5–5.1)
Sodium: 131 mmol/L — ABNORMAL LOW (ref 135–145)
Total Bilirubin: 1.4 mg/dL — ABNORMAL HIGH (ref 0.0–1.2)
Total Protein: 8.4 g/dL — ABNORMAL HIGH (ref 6.5–8.1)

## 2023-11-19 LAB — MAGNESIUM: Magnesium: 2.1 mg/dL (ref 1.7–2.4)

## 2023-11-19 LAB — LACTIC ACID, PLASMA
Lactic Acid, Venous: 1.8 mmol/L (ref 0.5–1.9)
Lactic Acid, Venous: 1.6 mmol/L (ref 0.5–1.9)

## 2023-11-19 LAB — ETHANOL: Alcohol, Ethyl (B): 20 mg/dL — ABNORMAL HIGH (ref <15)

## 2023-11-19 LAB — LIPASE, BLOOD: Lipase: 2381 U/L — ABNORMAL HIGH (ref 11–51)

## 2023-11-19 MED ORDER — THIAMINE MONONITRATE 100 MG PO TABS
100.0000 mg | ORAL_TABLET | Freq: Every day | ORAL | Status: DC
  Administered 2023-11-19 – 2023-11-21 (×3): 100 mg via ORAL
  Filled 2023-11-19 (×3): qty 1

## 2023-11-19 MED ORDER — HYDROMORPHONE HCL 1 MG/ML IJ SOLN
0.5000 mg | Freq: Once | INTRAMUSCULAR | Status: AC
  Administered 2023-11-19: 0.5 mg via INTRAVENOUS
  Filled 2023-11-19: qty 0.5

## 2023-11-19 MED ORDER — FOLIC ACID 1 MG PO TABS
1.0000 mg | ORAL_TABLET | Freq: Every day | ORAL | Status: DC
  Administered 2023-11-19 – 2023-11-21 (×3): 1 mg via ORAL
  Filled 2023-11-19 (×3): qty 1

## 2023-11-19 MED ORDER — POLYETHYLENE GLYCOL 3350 17 G PO PACK: 17.0000 g | PACK | Freq: Every day | ORAL | Status: DC | PRN

## 2023-11-19 MED ORDER — ONDANSETRON HCL 4 MG PO TABS: 4.0000 mg | ORAL_TABLET | Freq: Four times a day (QID) | ORAL | Status: DC | PRN

## 2023-11-19 MED ORDER — THIAMINE HCL 100 MG/ML IJ SOLN
100.0000 mg | Freq: Every day | INTRAMUSCULAR | Status: DC
  Filled 2023-11-19: qty 2

## 2023-11-19 MED ORDER — PANTOPRAZOLE SODIUM 40 MG IV SOLR
40.0000 mg | INTRAVENOUS | Status: DC
  Administered 2023-11-19 – 2023-11-20 (×2): 40 mg via INTRAVENOUS
  Filled 2023-11-19 (×2): qty 10

## 2023-11-19 MED ORDER — LORAZEPAM 1 MG PO TABS: 1.0000 mg | ORAL_TABLET | ORAL | Status: DC | PRN

## 2023-11-19 MED ORDER — IOHEXOL 300 MG/ML  SOLN
100.0000 mL | Freq: Once | INTRAMUSCULAR | Status: AC | PRN
  Administered 2023-11-19: 100 mL via INTRAVENOUS

## 2023-11-19 MED ORDER — ONDANSETRON HCL 4 MG/2ML IJ SOLN
4.0000 mg | Freq: Once | INTRAMUSCULAR | Status: AC
  Administered 2023-11-19: 4 mg via INTRAVENOUS
  Filled 2023-11-19: qty 2

## 2023-11-19 MED ORDER — ENOXAPARIN SODIUM 40 MG/0.4ML IJ SOSY
40.0000 mg | PREFILLED_SYRINGE | INTRAMUSCULAR | Status: DC
  Administered 2023-11-19 – 2023-11-20 (×2): 40 mg via SUBCUTANEOUS
  Filled 2023-11-19 (×2): qty 0.4

## 2023-11-19 MED ORDER — HYDROMORPHONE HCL 1 MG/ML IJ SOLN
1.0000 mg | INTRAMUSCULAR | Status: DC | PRN
  Administered 2023-11-19 – 2023-11-20 (×3): 1 mg via INTRAVENOUS
  Filled 2023-11-19 (×3): qty 1

## 2023-11-19 MED ORDER — SODIUM CHLORIDE 0.9 % IV BOLUS
1000.0000 mL | Freq: Once | INTRAVENOUS | Status: AC
  Administered 2023-11-19: 1000 mL via INTRAVENOUS

## 2023-11-19 MED ORDER — DEXTROSE-SODIUM CHLORIDE 5-0.9 % IV SOLN: INTRAVENOUS | Status: DC

## 2023-11-19 MED ORDER — ACETAMINOPHEN 325 MG PO TABS
650.0000 mg | ORAL_TABLET | Freq: Four times a day (QID) | ORAL | Status: DC | PRN
  Administered 2023-11-20: 650 mg via ORAL
  Filled 2023-11-19: qty 2

## 2023-11-19 MED ORDER — ONDANSETRON HCL 4 MG/2ML IJ SOLN: 4.0000 mg | Freq: Four times a day (QID) | INTRAMUSCULAR | Status: DC | PRN

## 2023-11-19 MED ORDER — ACETAMINOPHEN 650 MG RE SUPP: 650.0000 mg | Freq: Four times a day (QID) | RECTAL | Status: DC | PRN

## 2023-11-19 MED ORDER — ADULT MULTIVITAMIN W/MINERALS CH
1.0000 | ORAL_TABLET | Freq: Every day | ORAL | Status: DC
  Administered 2023-11-19 – 2023-11-21 (×3): 1 via ORAL
  Filled 2023-11-19 (×3): qty 1

## 2023-11-19 MED ORDER — LORAZEPAM 2 MG/ML IJ SOLN: 1.0000 mg | INTRAMUSCULAR | Status: DC | PRN

## 2023-11-19 MED ORDER — GABAPENTIN 300 MG PO CAPS
300.0000 mg | ORAL_CAPSULE | Freq: Three times a day (TID) | ORAL | Status: DC
  Administered 2023-11-19 – 2023-11-21 (×5): 300 mg via ORAL
  Filled 2023-11-19 (×5): qty 1

## 2023-11-19 NOTE — ED Provider Notes (Signed)
 [facility] MEDICAL SURGICAL UNIT Provider Note  CSN: [id number] Arrival date & time: 11/19/23 1525  Chief Complaint(s) Pancreatitis  HPI Colin Ellis is a 63 y.o. male history of hypertension, COPD, alcohol use, recurrent pancreatitis presenting to the emergency department with abdominal pain.  Patient reports the pain feels similar to prior episode of pancreatitis.  Reports the pain in in his epigastric region.  Reports the pain is severe.  Pain began yesterday.  Does report drinking alcohol recently.  Reports associated nausea, no vomiting.  No back pain.  No urinary symptoms.  No fevers, reports some chills.  Denies any chest pain or shortness of breath.   Past Medical History Past Medical History:  Diagnosis Date   ADH disorder    Arthritis    Electrical injury in adult    Electrician by trade, several shocks during working career    Psoriasis    Teeth missing due to trauma    Patient Active Problem List   Diagnosis Date Noted   GAD (generalized anxiety disorder) 10/27/2023   Hospital discharge follow-up 09/25/2023   Alcohol-induced acute pancreatitis 08/12/2023   Epigastric pain 08/08/2023   Primary osteoarthritis of both hips 07/23/2023   Fall 06/30/2023   Gastroesophageal reflux disease 06/30/2023   Encounter for examination following treatment at hospital 06/18/2023   Degeneration of intervertebral disc of lumbar region with discogenic back pain and lower extremity pain 06/17/2023   Cognitive decline 05/30/2023   Arthritis of left acromioclavicular joint 03/14/2023   Moderate episode of recurrent major depressive disorder (HCC) 03/14/2023   Pain of left clavicle 02/21/2023   Chronic left shoulder pain 02/21/2023   Psoriasis 12/02/2022   Tobacco abuse 11/08/2022   Alcohol dependence with unspecified alcohol-induced disorder (HCC) 11/08/2022   DDD (degenerative disc disease), cervical 08/09/2022   BPH (benign prostatic hyperplasia) 05/17/2022   Essential  hypertension 02/19/2022   Left parietal scalp hematoma 01/31/2022   COPD (chronic obstructive pulmonary disease) (HCC) 01/26/2022   Encounter for general adult medical examination with abnormal findings 01/26/2022   Chest pain 09/21/2021   Colon cancer screening 06/08/2021   S/P lumbar spinal fusion 05/28/2017   Home Medication(s) Prior to Admission medications   Medication Sig Start Date End Date Taking? Authorizing Provider  baclofen  (LIORESAL ) 10 MG tablet TAKE 1 TABLET(10 MG) BY MOUTH TWICE DAILY 10/16/23   Patel, Rutwik K, MD  busPIRone (BUSPAR) 7.5 MG tablet Take 1 tablet (7.5 mg total) by mouth 2 (two) times daily. 10/27/23   [name], MD  clobetasol  ointment (TEMOVATE ) 0.05 % Apply 1 Application topically 2 (two) times daily. 02/21/23   [name], MD  Cyanocobalamin (B-12) 1000 MCG TBCR Take 1 tablet by mouth daily. 09/15/23   Pearlean Manus, MD  folic acid  (FOLVITE ) 1 MG tablet Take 1 tablet (1 mg total) by mouth daily. 09/15/23 09/14/24  Pearlean Manus, MD  gabapentin  (NEURONTIN ) 300 MG capsule Take 1 capsule (300 mg total) by mouth 3 (three) times daily. 09/15/23   Pearlean Manus, MD  hydrOXYzine (VISTARIL) 25 MG capsule Take 1 capsule (25 mg total) by mouth every 8 (eight) hours as needed. 11/06/23   [name], MD  losartan  (COZAAR ) 100 MG tablet Take 1 tablet (100 mg total) by mouth daily. 09/15/23   Pearlean Manus, MD  Melatonin 5 MG CAPS Take 3 each by mouth at bedtime.    [provider]  Multiple Vitamins-Minerals (MULTIVITAMIN WITH MINERALS) tablet Take 1 tablet by mouth daily. 09/15/23 09/14/24  Emokpae,  Courage, MD  pantoprazole  (PROTONIX ) 40 MG tablet Take 1 tablet (40 mg total) by mouth daily. 09/15/23 09/14/24  Pearlean Manus, MD  Thiamine  HCl (B-1) 100 MG TABS Take 1 tablet (100 mg total) by mouth daily. 09/15/23   Pearlean Manus, MD                                                                                                                                     Past Surgical History Past Surgical History:  Procedure Laterality Date   BACK SURGERY     herniated disc   COLONOSCOPY  11/12/2010   Surgeon: Lamar CHRISTELLA Hollingshead, MD;  pancolonic diverticulosis, otherwise normal exam.   COLONOSCOPY WITH PROPOFOL  N/A 07/09/2021   Surgeon: Hollingshead Lamar CHRISTELLA, MD; pancolonic diverticulosis, redundant colon, otherwise normal exam.  Recommended 10-year screening.   Family History Family History  Problem Relation Age of Onset   Colon cancer Paternal Grandfather        89s or 24s   Anesthesia problems Neg Hx    Hypotension Neg Hx    Malignant hyperthermia Neg Hx    Pseudochol deficiency Neg Hx     Social History Social History   Tobacco Use   Smoking status: Every Day    Current packs/day: 1.00    Average packs/day: 1 pack/day for 40.0 years (40.0 ttl pk-yrs)    Types: Cigarettes    Passive exposure: Current   Smokeless tobacco: Never   Tobacco comments:    13 cigarettes a day as of 06/30/23  Vaping Use   Vaping status: Never Used  Substance Use Topics   Alcohol use: Yes    Alcohol/week: 4.0 standard drinks of alcohol    Types: 4 Cans of beer per week    Comment: 3-4 beers a night   Drug use: No   Allergies Sulfa antibiotics  Review of Systems Review of Systems  All other systems reviewed and are negative.   Physical Exam Vital Signs  I have reviewed the triage vital signs BP (!) 152/76 (BP Location: Left Arm)   Pulse 72   Temp 98.6 F (37 C) (Oral)   Resp 18   Ht 5' 11 (1.803 m)   Wt 72.7 kg   SpO2 99%   BMI 22.35 kg/m  Physical Exam Vitals and nursing note reviewed.  Constitutional:      General: He is in acute distress.     Appearance: Normal appearance.  HENT:     Mouth/Throat:     Mouth: Mucous membranes are moist.  Eyes:     Conjunctiva/sclera: Conjunctivae normal.  Cardiovascular:     Rate and Rhythm: Normal rate and regular rhythm.  Pulmonary:     Effort: Pulmonary effort is normal. No  respiratory distress.     Breath sounds: Normal breath sounds.  Abdominal:     General: Abdomen is flat.     Palpations: Abdomen is soft.  Tenderness: There is abdominal tenderness (epigastric region).  Musculoskeletal:     Right lower leg: No edema.     Left lower leg: No edema.  Skin:    General: Skin is warm and dry.     Capillary Refill: Capillary refill takes less than 2 seconds.  Neurological:     Mental Status: He is alert and oriented to person, place, and time. Mental status is at baseline.  Psychiatric:        Mood and Affect: Mood normal.        Behavior: Behavior normal.     ED Results and Treatments Labs (all labs ordered are listed, but only abnormal results are displayed) Labs Reviewed  LIPASE, BLOOD - Abnormal; Notable for the following components:      Result Value   Lipase 2,381 (*)    All other components within normal limits  COMPREHENSIVE METABOLIC PANEL WITH GFR - Abnormal; Notable for the following components:   Sodium 131 (*)    Chloride 90 (*)    CO2 17 (*)    BUN 7 (*)    Total Protein 8.4 (*)    AST 80 (*)    ALT 80 (*)    Total Bilirubin 1.4 (*)    Anion gap 24 (*)    All other components within normal limits  URINALYSIS, ROUTINE W REFLEX MICROSCOPIC - Abnormal; Notable for the following components:   Color, Urine STRAW (*)    Ketones, ur 20 (*)    All other components within normal limits  CBC WITH DIFFERENTIAL/PLATELET - Abnormal; Notable for the following components:   WBC 11.8 (*)    Neutro Abs 10.1 (*)    All other components within normal limits  ETHANOL - Abnormal; Notable for the following components:   Alcohol, Ethyl (B) 20 (*)    All other components within normal limits  LACTIC ACID, PLASMA  LACTIC ACID, PLASMA  MAGNESIUM  COMPREHENSIVE METABOLIC PANEL WITH GFR  CBC                                                                                                                          Radiology CT ABDOMEN PELVIS W  CONTRAST Result Date: 11/19/2023 EXAM: CT ABDOMEN AND PELVIS WITH CONTRAST 11/19/2023 05:39:40 PM TECHNIQUE: CT of the abdomen and pelvis was performed with the administration of 100 mL of iohexol  (OMNIPAQUE ) 300 MG/ML solution. Multiplanar reformatted images are provided for review. Automated exposure control, iterative reconstruction, and/or weight-based adjustment of the mA/kV was utilized to reduce the radiation dose to as low as reasonably achievable. COMPARISON: 09/12/2023 CLINICAL HISTORY: Abdominal pain, acute, nonlocalized. Pt arrived via POV c/o recurrent pancreatitis. Pt reports he was drinking yesterday and understands this can cause him to have pancreatitis. FINDINGS: LOWER CHEST: No acute abnormality. LIVER: Hepatic steatosis. GALLBLADDER AND BILE DUCTS: Gallbladder is unremarkable. No biliary ductal dilatation. SPLEEN: No acute abnormality. PANCREAS: Mild inflammatory changes are noted around the pancreatic body and tail suggesting acute  pancreatitis. No pseudocyst formation is noted. ADRENAL GLANDS: No acute abnormality. KIDNEYS, URETERS AND BLADDER: No stones in the kidneys or ureters. No hydronephrosis. No perinephric or periureteral stranding. Urinary bladder is unremarkable. GI AND BOWEL: Stomach demonstrates no acute abnormality. There is no bowel obstruction. PERITONEUM AND RETROPERITONEUM: No ascites. No free air. VASCULATURE: Aorta is normal in caliber. LYMPH NODES: No lymphadenopathy. REPRODUCTIVE ORGANS: No acute abnormality. BONES AND SOFT TISSUES: No acute osseous abnormality. No focal soft tissue abnormality. IMPRESSION: 1. Acute pancreatitis involving the pancreatic body and tail. No pseudocyst formation. 2. Hepatic steatosis. Electronically signed by: Lynwood Seip MD 11/19/2023 05:51 PM EDT RP Workstation: [id number]    Pertinent labs & imaging results that were available during my care of the patient were reviewed by me and considered in my medical decision making (see MDM for  details).  Medications Ordered in ED Medications  HYDROmorphone  (DILAUDID ) injection 1 mg (1 mg Intravenous Given 11/19/23 2057)  pantoprazole  (PROTONIX ) injection 40 mg (40 mg Intravenous Given 11/19/23 2105)  gabapentin  (NEURONTIN ) capsule 300 mg (300 mg Oral Given 11/19/23 2105)  enoxaparin  (LOVENOX ) injection 40 mg (40 mg Subcutaneous Given 11/19/23 2105)  dextrose  5 %-0.9 % sodium chloride  infusion ( Intravenous New Bag/Given 11/19/23 2110)  acetaminophen  (TYLENOL ) tablet 650 mg (has no administration in time range)    Or  acetaminophen  (TYLENOL ) suppository 650 mg (has no administration in time range)  ondansetron  (ZOFRAN ) tablet 4 mg (has no administration in time range)    Or  ondansetron  (ZOFRAN ) injection 4 mg (has no administration in time range)  polyethylene glycol (MIRALAX / GLYCOLAX) packet 17 g (has no administration in time range)  LORazepam  (ATIVAN ) tablet 1-4 mg (has no administration in time range)    Or  LORazepam  (ATIVAN ) injection 1-4 mg (has no administration in time range)  thiamine  (VITAMIN B1) tablet 100 mg (100 mg Oral Given 11/19/23 2056)    Or  thiamine  (VITAMIN B1) injection 100 mg ( Intravenous See Alternative 11/19/23 2056)  folic acid  (FOLVITE ) tablet 1 mg (1 mg Oral Given 11/19/23 2056)  multivitamin with minerals tablet 1 tablet (1 tablet Oral Given 11/19/23 2056)  HYDROmorphone  (DILAUDID ) injection 0.5 mg (0.5 mg Intravenous Given 11/19/23 1552)  ondansetron  (ZOFRAN ) injection 4 mg (4 mg Intravenous Given 11/19/23 1552)  sodium chloride  0.9 % bolus 1,000 mL (0 mLs Intravenous Stopped 11/19/23 1655)  HYDROmorphone  (DILAUDID ) injection 0.5 mg (0.5 mg Intravenous Given 11/19/23 1642)  iohexol  (OMNIPAQUE ) 300 MG/ML solution 100 mL (100 mLs Intravenous Contrast Given 11/19/23 1722)                                                                                                                                     Procedures Procedures  (including  critical care time)  Medical Decision Making / ED Course   MDM:  63 year old presenting to the emergency department abdominal pain.  Patient in distress due to pain.  Examination  reveals epigastric tenderness.  Differential includes pancreatitis, gastritis, GERD, perforation, obstruction, volvulus, cholecystitis.  EKG reassuring, doubt cardiovascular pathology.  Will obtain labs as well as CT scan and reassess.  Will give pain medication  Clinical Course as of 11/19/23 2251  Wed Nov 19, 2023  1858 CT scan shows pancreatitis.  Lipase very elevated.  Reports ongoing pain.  Given age will admit.  Signed out to hospitalist [WS]    Clinical Course User Index [WS] Francesca Elsie CROME, MD     Additional history obtained: -Additional history obtained from spouse -External records from outside source obtained and reviewed including: Chart review including previous notes, labs, imaging, consultation notes including prior notes    Lab Tests: -I ordered, reviewed, and interpreted labs.   The pertinent results include:   Labs Reviewed  LIPASE, BLOOD - Abnormal; Notable for the following components:      Result Value   Lipase 2,381 (*)    All other components within normal limits  COMPREHENSIVE METABOLIC PANEL WITH GFR - Abnormal; Notable for the following components:   Sodium 131 (*)    Chloride 90 (*)    CO2 17 (*)    BUN 7 (*)    Total Protein 8.4 (*)    AST 80 (*)    ALT 80 (*)    Total Bilirubin 1.4 (*)    Anion gap 24 (*)    All other components within normal limits  URINALYSIS, ROUTINE W REFLEX MICROSCOPIC - Abnormal; Notable for the following components:   Color, Urine STRAW (*)    Ketones, ur 20 (*)    All other components within normal limits  CBC WITH DIFFERENTIAL/PLATELET - Abnormal; Notable for the following components:   WBC 11.8 (*)    Neutro Abs 10.1 (*)    All other components within normal limits  ETHANOL - Abnormal; Notable for the following components:    Alcohol, Ethyl (B) 20 (*)    All other components within normal limits  LACTIC ACID, PLASMA  LACTIC ACID, PLASMA  MAGNESIUM  COMPREHENSIVE METABOLIC PANEL WITH GFR  CBC    Notable for elevated lipase  EKG   EKG Interpretation Date/Time:  Wednesday November 19 2023 15:46:33 EDT Ventricular Rate:  69 PR Interval:  163 QRS Duration:  93 QT Interval:  415 QTC Calculation: 445 R Axis:   21  Text Interpretation: Sinus rhythm Probable anteroseptal infarct, old Confirmed by Francesca Elsie ([id number]) on 11/19/2023 3:55:17 PM         Imaging Studies ordered: I ordered imaging studies including CT abdomen On my interpretation imaging demonstrates pancreatitis  I independently visualized and interpreted imaging. I agree with the radiologist interpretation   Medicines ordered and prescription drug management: Meds ordered this encounter  Medications   HYDROmorphone  (DILAUDID ) injection 0.5 mg   ondansetron  (ZOFRAN ) injection 4 mg   sodium chloride  0.9 % bolus 1,000 mL   HYDROmorphone  (DILAUDID ) injection 0.5 mg   iohexol  (OMNIPAQUE ) 300 MG/ML solution 100 mL   HYDROmorphone  (DILAUDID ) injection 1 mg   pantoprazole  (PROTONIX ) injection 40 mg   gabapentin  (NEURONTIN ) capsule 300 mg   enoxaparin  (LOVENOX ) injection 40 mg   dextrose  5 %-0.9 % sodium chloride  infusion   OR Linked Order Group    acetaminophen  (TYLENOL ) tablet 650 mg    acetaminophen  (TYLENOL ) suppository 650 mg   OR Linked Order Group    ondansetron  (ZOFRAN ) tablet 4 mg    ondansetron  (ZOFRAN ) injection 4 mg   polyethylene glycol (MIRALAX / GLYCOLAX) packet  17 g   OR Linked Order Group    LORazepam  (ATIVAN ) tablet 1-4 mg     CIWA-AR < 5 =:   0 mg     CIWA-AR 5 -10 =:   1 mg     CIWA-AR 11 -15 =:   2 mg     CIWA-AR 16 -20 =:   3 mg     CIWA-AR 16 -20 =:   Recheck CIWA-AR in 1 hour; if > 20 notify MD     CIWA-AR > 20 =:   4 mg     CIWA-AR > 20 =:   Call Rapid Response    LORazepam  (ATIVAN ) injection 1-4 mg      CIWA-AR < 5 =:   0 mg     CIWA-AR 5 -10 =:   1 mg     CIWA-AR 11 -15 =:   2 mg     CIWA-AR 16 -20 =:   3 mg     CIWA-AR 16 -20 =:   Recheck CIWA-AR in 1 hour; if > 20 notify MD     CIWA-AR > 20 =:   4 mg     CIWA-AR > 20 =:   Call Rapid Response   OR Linked Order Group    thiamine  (VITAMIN B1) tablet 100 mg    thiamine  (VITAMIN B1) injection 100 mg   folic acid  (FOLVITE ) tablet 1 mg   multivitamin with minerals tablet 1 tablet    -I have reviewed the patients home medicines and have made adjustments as needed   Consultations Obtained: I requested consultation with the hospitalist,  and discussed lab and imaging findings as well as pertinent plan - they recommend: admission   Cardiac Monitoring: The patient was maintained on a cardiac monitor.  I personally viewed and interpreted the cardiac monitored which showed an underlying rhythm of: NSR  Social Determinants of Health:  Diagnosis or treatment significantly limited by social determinants of health: alcohol use   Reevaluation: After the interventions noted above, I reevaluated the patient and found that their symptoms have improved  Co morbidities that complicate the patient evaluation  Past Medical History:  Diagnosis Date   ADH disorder    Arthritis    Electrical injury in adult    Electrician by trade, several shocks during working career    Psoriasis    Teeth missing due to trauma       Dispostion: Disposition decision including need for hospitalization was considered, and patient admitted to the hospital.    Final Clinical Impression(s) / ED Diagnoses Final diagnoses:  Alcohol-induced acute pancreatitis without infection or necrosis     This chart was dictated using voice recognition software.  Despite best efforts to proofread,  errors can occur which can change the documentation meaning.    Francesca Elsie CROME, MD 11/19/23 2251

## 2023-11-19 NOTE — H&P (Signed)
 History and Physical    Colin Ellis:[id number] DOB: 1960/10/25 DOA: 11/19/2023  PCP: [name], MD   Patient coming from: Home  I have personally briefly reviewed patient's old medical records in [facility]  Chief Complaint: Abdominal pain  HPI: Colin Ellis is a 63 y.o. male with medical history significant for alcohol abuse, BPH, COPD, hypertension, pancreatitis. Patient presented to the ED with complaints of abdominal pain that started today.  No vomiting.  No diarrhea.  No fevers no chills.  Patient's last drink of alcohol was today, at about 10 AM.  He denies daily alcohol intake, but reports on average he drinks about 6-7 beers.  Denies history of alcohol withdrawal.  No history of seizures.  Recently hospitalized 8/22 to 8/25 for acute pancreatitis.  ED Course: Temperature 97.8.  Heart rate 60s to 70s.  Respiratory rate 16- 31.  Blood pressure systolic 120s to 859d.  O2 sat greater than 94% on room air. Lipase 2381.  Sodium 131. Anion gap of 24, serum bicarb of 17. AST 80, ALT 80. Lactic acid 1.8. CTAP W contrast-acute pancreatitis. IV Dilaudid  0.5 given, patient reports no improvement in pain. 1 L bolus given.  Review of Systems: As per HPI all other systems reviewed and negative.  Past Medical History:  Diagnosis Date   ADH disorder    Arthritis    Electrical injury in adult    Electrician by trade, several shocks during working career    Psoriasis    Teeth missing due to trauma     Past Surgical History:  Procedure Laterality Date   BACK SURGERY     herniated disc   COLONOSCOPY  11/12/2010   Surgeon: Colin CHRISTELLA Hollingshead, MD;  pancolonic diverticulosis, otherwise normal exam.   COLONOSCOPY WITH PROPOFOL  N/A 07/09/2021   Surgeon: Ellis Colin CHRISTELLA, MD; pancolonic diverticulosis, redundant colon, otherwise normal exam.  Recommended 10-year screening.     reports that he has been smoking cigarettes. He has a 40 pack-year smoking history. He  has been exposed to tobacco smoke. He has never used smokeless tobacco. He reports current alcohol use of about 4.0 standard drinks of alcohol per week. He reports that he does not use drugs.  Allergies  Allergen Reactions   Sulfa Antibiotics Nausea And Vomiting    Upset stomach    Family History  Problem Relation Age of Onset   Colon cancer Paternal Grandfather        58s or 58s   Anesthesia problems Neg Hx    Hypotension Neg Hx    Malignant hyperthermia Neg Hx    Pseudochol deficiency Neg Hx     Prior to Admission medications   Medication Sig Start Date End Date Taking? Authorizing Provider  baclofen  (LIORESAL ) 10 MG tablet TAKE 1 TABLET(10 MG) BY MOUTH TWICE DAILY 10/16/23   Patel, Rutwik K, MD  busPIRone (BUSPAR) 7.5 MG tablet Take 1 tablet (7.5 mg total) by mouth 2 (two) times daily. 10/27/23   [name], MD  clobetasol  ointment (TEMOVATE ) 0.05 % Apply 1 Application topically 2 (two) times daily. 02/21/23   [name], MD  Cyanocobalamin (B-12) 1000 MCG TBCR Take 1 tablet by mouth daily. 09/15/23   Pearlean Manus, MD  folic acid  (FOLVITE ) 1 MG tablet Take 1 tablet (1 mg total) by mouth daily. 09/15/23 09/14/24  Pearlean Manus, MD  gabapentin  (NEURONTIN ) 300 MG capsule Take 1 capsule (300 mg total) by mouth 3 (three) times daily. 09/15/23  Pearlean Manus, MD  hydrOXYzine (VISTARIL) 25 MG capsule Take 1 capsule (25 mg total) by mouth every 8 (eight) hours as needed. 11/06/23   [name], MD  losartan  (COZAAR ) 100 MG tablet Take 1 tablet (100 mg total) by mouth daily. 09/15/23   Pearlean Manus, MD  Melatonin 5 MG CAPS Take 3 each by mouth at bedtime.    [provider]  Multiple Vitamins-Minerals (MULTIVITAMIN WITH MINERALS) tablet Take 1 tablet by mouth daily. 09/15/23 09/14/24  Pearlean Manus, MD  pantoprazole  (PROTONIX ) 40 MG tablet Take 1 tablet (40 mg total) by mouth daily. 09/15/23 09/14/24  Pearlean Manus, MD  Thiamine  HCl (B-1) 100 MG TABS Take 1  tablet (100 mg total) by mouth daily. 09/15/23   Pearlean Manus, MD    Physical Exam: Vitals:   11/19/23 1630 11/19/23 1645 11/19/23 1700 11/19/23 1715  BP: 127/71 131/70 (!) 142/71 130/74  Pulse: 67 72 72 70  Resp: (!) 31 (!) 26 17 17   Temp:      TempSrc:      SpO2: 96% 96% 94% 98%  Weight:      Height:        Constitutional: NAD, calm, comfortable Vitals:   11/19/23 1630 11/19/23 1645 11/19/23 1700 11/19/23 1715  BP: 127/71 131/70 (!) 142/71 130/74  Pulse: 67 72 72 70  Resp: (!) 31 (!) 26 17 17   Temp:      TempSrc:      SpO2: 96% 96% 94% 98%  Weight:      Height:       Eyes: PERRL, lids and conjunctivae normal ENMT: Mucous membranes are moist.   Neck: normal, supple, no masses, no thyromegaly Respiratory: clear to auscultation bilaterally, no wheezing, no crackles. Normal respiratory effort. No accessory muscle use.  Cardiovascular: Regular rate and rhythm, no murmurs / rubs / gallops. No extremity edema.  Extremities warm. Abdomen: Mild diffuse tenderness, abdomen soft, no masses palpated. No hepatosplenomegaly.  Musculoskeletal: no clubbing / cyanosis. No joint deformity upper and lower extremities.  Skin: no rashes, lesions, ulcers. No induration Neurologic: No facial asymmetry, moves extremity spontaneously, speech fluent Psychiatric: Normal judgment and insight. Alert and oriented x 3. Normal mood.   Labs on Admission: I have personally reviewed following labs and imaging studies  CBC: Recent Labs  Lab 11/19/23 1602  WBC 11.8*  NEUTROABS 10.1*  HGB 16.7  HCT 47.5  MCV 94.4  PLT 250   Basic Metabolic Panel: Recent Labs  Lab 11/19/23 1602  NA 131*  K 4.0  CL 90*  CO2 17*  GLUCOSE 86  BUN 7*  CREATININE 0.73  CALCIUM 9.7   GFR: Estimated Creatinine Clearance: 99.5 mL/min (by C-G formula based on SCr of 0.73 mg/dL). Liver Function Tests: Recent Labs  Lab 11/19/23 1602  AST 80*  ALT 80*  ALKPHOS 109  BILITOT 1.4*  PROT 8.4*  ALBUMIN 5.0    Recent Labs  Lab 11/19/23 1602  LIPASE 2,381*   Radiological Exams on Admission: CT ABDOMEN PELVIS W CONTRAST Result Date: 11/19/2023 EXAM: CT ABDOMEN AND PELVIS WITH CONTRAST 11/19/2023 05:39:40 PM TECHNIQUE: CT of the abdomen and pelvis was performed with the administration of 100 mL of iohexol  (OMNIPAQUE ) 300 MG/ML solution. Multiplanar reformatted images are provided for review. Automated exposure control, iterative reconstruction, and/or weight-based adjustment of the mA/kV was utilized to reduce the radiation dose to as low as reasonably achievable. COMPARISON: 09/12/2023 CLINICAL HISTORY: Abdominal pain, acute, nonlocalized. Pt arrived via POV c/o recurrent pancreatitis. Pt  reports he was drinking yesterday and understands this can cause him to have pancreatitis. FINDINGS: LOWER CHEST: No acute abnormality. LIVER: Hepatic steatosis. GALLBLADDER AND BILE DUCTS: Gallbladder is unremarkable. No biliary ductal dilatation. SPLEEN: No acute abnormality. PANCREAS: Mild inflammatory changes are noted around the pancreatic body and tail suggesting acute pancreatitis. No pseudocyst formation is noted. ADRENAL GLANDS: No acute abnormality. KIDNEYS, URETERS AND BLADDER: No stones in the kidneys or ureters. No hydronephrosis. No perinephric or periureteral stranding. Urinary bladder is unremarkable. GI AND BOWEL: Stomach demonstrates no acute abnormality. There is no bowel obstruction. PERITONEUM AND RETROPERITONEUM: No ascites. No free air. VASCULATURE: Aorta is normal in caliber. LYMPH NODES: No lymphadenopathy. REPRODUCTIVE ORGANS: No acute abnormality. BONES AND SOFT TISSUES: No acute osseous abnormality. No focal soft tissue abnormality. IMPRESSION: 1. Acute pancreatitis involving the pancreatic body and tail. No pseudocyst formation. 2. Hepatic steatosis. Electronically signed by: Lynwood Seip MD 11/19/2023 05:51 PM EDT RP Workstation: [id number]   EKG: Independently reviewed.  Sinus rhythm, rate 69,  QTc 445.  No significant change from prior.  Assessment/Plan Principal Problem:   Alcohol-induced acute pancreatitis Active Problems:   COPD (chronic obstructive pulmonary disease) (HCC)   Essential hypertension  Assessment and Plan:  Acute pancreatitis-abdominal pain without vomiting. Pancreatitis provoked by alcohol abuse.  Lipase elevated at 2381.  CTAP W contrast-acute pancreatitis involving the pancreatic body and tail. - IV Dilaudid  1 mg every 4 hours as needed - 1 L bolus given, continue D5 N/s 100cc/hr x 1 day - N.p.o.  Alcohol abuse-last drink was today at 10 AM.  Denies history drawl, no seizure history.  Denies daily intake, but drinks 6-7 beers daily on average.  At this time, patient is calm, no sign of withdrawal. - CIWA as needed - Thiamine , folate, multivitamins - Potassium 4, check magnesium - Add-on blood alcohol level  Anion gap metabolic acidosis-anion gap of 24, serum bicarb of 17.  Likely due to alcoholic ketoacidosis.  Normal lactic acid 1.8.  Blood glucose 86.  No history of diabetes. - Hydrate  Elevated liver enzymes-AST 80, ALT 80, mildly elevated T. bili 1.4, normal ALP of 109.  Likely secondary to alcoholic liver disease, though pattern not  quite consistent.  Liver imaging shows hepatic steatosis, unremarkable gallbladder no biliary ductal dilatation. -Trend - Acute hep panel  COPD-stable. - Resume home regimen  Hypertension-stable. -Holding losartan  for contrast exposure.   DVT prophylaxis: Lovenox  Code Status: Full code Family Communication: None at bedside Disposition Plan: ~ 2 days Consults called:none  Admission status: inpt tele I certify that at the point of admission it is my clinical judgment that the patient will require inpatient hospital care spanning beyond 2 midnights from the point of admission due to high intensity of service, high risk for further deterioration and high frequency of surveillance required.   Author: Tully FORBES Carwin, MD 11/19/2023 7:52 PM  For on call review [email or web address].

## 2023-11-19 NOTE — ED Triage Notes (Signed)
 Pt arrived via POV c/o recurrent pancreatitis. Pt reports he was drinking yesterday and understands this can cause him to have pancreatitis.

## 2023-11-20 LAB — COMPREHENSIVE METABOLIC PANEL WITH GFR
ALT: 48 U/L — ABNORMAL HIGH (ref 0–44)
AST: 41 U/L (ref 15–41)
Albumin: 3.8 g/dL (ref 3.5–5.0)
Alkaline Phosphatase: 79 U/L (ref 38–126)
Anion gap: 13 (ref 5–15)
BUN: 5 mg/dL — ABNORMAL LOW (ref 8–23)
CO2: 21 mmol/L — ABNORMAL LOW (ref 22–32)
Calcium: 8.3 mg/dL — ABNORMAL LOW (ref 8.9–10.3)
Chloride: 102 mmol/L (ref 98–111)
Creatinine, Ser: 0.56 mg/dL — ABNORMAL LOW (ref 0.61–1.24)
GFR, Estimated: >60 mL/min (ref >60)
Glucose, Bld: 111 mg/dL — ABNORMAL HIGH (ref 70–99)
Potassium: 3.9 mmol/L (ref 3.5–5.1)
Sodium: 136 mmol/L (ref 135–145)
Total Bilirubin: 1 mg/dL (ref 0.0–1.2)
Total Protein: 6.1 g/dL — ABNORMAL LOW (ref 6.5–8.1)

## 2023-11-20 LAB — CBC
HCT: 40.3 % (ref 39.0–52.0)
Hemoglobin: 14.2 g/dL (ref 13.0–17.0)
MCH: 33.4 pg (ref 26.0–34.0)
MCHC: 35.2 g/dL (ref 30.0–36.0)
MCV: 94.8 fL (ref 80.0–100.0)
Platelets: 211 K/uL (ref 150–400)
RBC: 4.25 MIL/uL (ref 4.22–5.81)
RDW: 13 % (ref 11.5–15.5)
WBC: 10.6 K/uL — ABNORMAL HIGH (ref 4.0–10.5)
nRBC: 0 % (ref 0.0–0.2)

## 2023-11-20 MED ORDER — BUSPIRONE HCL 15 MG PO TABS
7.5000 mg | ORAL_TABLET | Freq: Two times a day (BID) | ORAL | Status: DC
  Administered 2023-11-20 – 2023-11-21 (×3): 7.5 mg via ORAL
  Filled 2023-11-20: qty 1
  Filled 2023-11-20: qty 2
  Filled 2023-11-20: qty 1

## 2023-11-20 MED ORDER — HYDROMORPHONE HCL 1 MG/ML IJ SOLN
1.0000 mg | INTRAMUSCULAR | Status: DC | PRN
  Administered 2023-11-20 – 2023-11-21 (×4): 1 mg via INTRAVENOUS
  Filled 2023-11-20 (×4): qty 1

## 2023-11-20 MED ORDER — BACLOFEN 10 MG PO TABS
10.0000 mg | ORAL_TABLET | Freq: Two times a day (BID) | ORAL | Status: DC
  Administered 2023-11-20 – 2023-11-21 (×3): 10 mg via ORAL
  Filled 2023-11-20 (×3): qty 1

## 2023-11-20 MED ORDER — LOSARTAN POTASSIUM 50 MG PO TABS
100.0000 mg | ORAL_TABLET | Freq: Every day | ORAL | Status: DC
  Administered 2023-11-20 – 2023-11-21 (×2): 100 mg via ORAL
  Filled 2023-11-20 (×2): qty 2

## 2023-11-20 MED ORDER — NICOTINE 14 MG/24HR TD PT24
14.0000 mg | MEDICATED_PATCH | Freq: Every day | TRANSDERMAL | Status: DC
  Administered 2023-11-20 – 2023-11-21 (×2): 14 mg via TRANSDERMAL
  Filled 2023-11-20 (×2): qty 1

## 2023-11-20 NOTE — Plan of Care (Signed)

## 2023-11-20 NOTE — Progress Notes (Signed)
 PROGRESS NOTE    Colin Ellis  FMW:[id number] DOB: 1960-02-02 DOA: 11/19/2023 PCP: [name], MD   Brief Narrative:    Colin Ellis is a 63 y.o. male with medical history significant for alcohol abuse, BPH, COPD, hypertension, pancreatitis. Patient presented to the ED with complaints of abdominal pain that started today.  Patient was admitted with acute alcoholic pancreatitis.  Assessment & Plan:   Principal Problem:   Alcohol-induced acute pancreatitis Active Problems:   COPD (chronic obstructive pulmonary disease) (HCC)   Essential hypertension  Assessment and Plan:   Acute pancreatitis-abdominal pain without vomiting. Pancreatitis provoked by alcohol abuse.  Lipase elevated at 2381.  CTAP W contrast-acute pancreatitis involving the pancreatic body and tail. - IV Dilaudid  1 mg every 2 hours as needed - Continue aggressive D5 NS - N.p.o. except sips -Continues to have significant amounts of abdominal pain as well as nausea and will refrain from further dietary advancement at this time -Recheck labs along with lipase in AM.   Alcohol abuse-last drink was today at 10 AM.  Denies history drawl, no seizure history.  Denies daily intake, but drinks 6-7 beers daily on average.  At this time, patient is calm, no sign of withdrawal. - CIWA as needed - Thiamine , folate, multivitamins - Potassium 4, check magnesium - Add-on blood alcohol level   Elevated liver enzymes-AST 80, ALT 80, mildly elevated T. bili 1.4, normal ALP of 109.  Likely secondary to alcoholic liver disease, though pattern not  quite consistent.  Liver imaging shows hepatic steatosis, unremarkable gallbladder no biliary ductal dilatation. -Trend   COPD-stable. - Resume home regimen   Hypertension-stable. - Okay to resume losartan  today   DVT prophylaxis:Lovenox  Code Status: Full Family Communication: None at bedside Disposition Plan:  Status is: Inpatient Remains inpatient appropriate because:  Need for IV medications.  Consultants:  None  Procedures:  None  Antimicrobials:  None   Subjective: Patient seen and evaluated today with ongoing severe amounts of abdominal pain as well as nausea.  Objective: Vitals:   11/19/23 2225 11/20/23 0019 11/20/23 0402 11/20/23 0753  BP:  (!) 152/66 (!) 144/75 (!) 156/87  Pulse:  66 76 78  Resp:      Temp:  98.4 F (36.9 C) 97.8 F (36.6 C) 97.7 F (36.5 C)  TempSrc:  Oral Oral Oral  SpO2:  94% 94% 93%  Weight: 72.7 kg     Height:       No intake or output data in the 24 hours ending 11/20/23 1029 Filed Weights   11/19/23 1529 11/19/23 2225  Weight: 74.4 kg 72.7 kg    Examination:  General exam: Appears calm and comfortable  Respiratory system: Clear to auscultation. Respiratory effort normal. Cardiovascular system: S1 & S2 heard, RRR.  Gastrointestinal system: Abdomen is soft Central nervous system: Alert and awake Extremities: No edema Skin: No significant lesions noted Psychiatry: Flat affect.    Data Reviewed: I have personally reviewed following labs and imaging studies  CBC: Recent Labs  Lab 11/19/23 1602 11/20/23 0425  WBC 11.8* 10.6*  NEUTROABS 10.1*  --   HGB 16.7 14.2  HCT 47.5 40.3  MCV 94.4 94.8  PLT 250 211   Basic Metabolic Panel: Recent Labs  Lab 11/19/23 1602 11/19/23 1945 11/20/23 0425  NA 131*  --  136  K 4.0  --  3.9  CL 90*  --  102  CO2 17*  --  21*  GLUCOSE 86  --  111*  BUN 7*  --  5*  CREATININE 0.73  --  0.56*  CALCIUM 9.7  --  8.3*  MG  --  2.1  --    GFR: Estimated Creatinine Clearance: 95.9 mL/min (A) (by C-G formula based on SCr of 0.56 mg/dL (L)). Liver Function Tests: Recent Labs  Lab 11/19/23 1602 11/20/23 0425  AST 80* 41  ALT 80* 48*  ALKPHOS 109 79  BILITOT 1.4* 1.0  PROT 8.4* 6.1*  ALBUMIN 5.0 3.8   Recent Labs  Lab 11/19/23 1602  LIPASE 2,381*   No results for input(s): AMMONIA in the last 168 hours. Coagulation Profile: No results for  input(s): INR, PROTIME in the last 168 hours. Cardiac Enzymes: No results for input(s): CKTOTAL, CKMB, CKMBINDEX, TROPONINI in the last 168 hours. BNP (last 3 results) No results for input(s): PROBNP in the last 8760 hours. HbA1C: No results for input(s): HGBA1C in the last 72 hours. CBG: No results for input(s): GLUCAP in the last 168 hours. Lipid Profile: No results for input(s): CHOL, HDL, LDLCALC, TRIG, CHOLHDL, LDLDIRECT in the last 72 hours. Thyroid  Function Tests: No results for input(s): TSH, T4TOTAL, FREET4, T3FREE, THYROIDAB in the last 72 hours. Anemia Panel: No results for input(s): VITAMINB12, FOLATE, FERRITIN, TIBC, IRON, RETICCTPCT in the last 72 hours. Sepsis Labs: Recent Labs  Lab 11/19/23 1754 11/19/23 1933  LATICACIDVEN 1.8 1.6    No results found for this or any previous visit (from the past 240 hours).       Radiology Studies: CT ABDOMEN PELVIS W CONTRAST Result Date: 11/19/2023 EXAM: CT ABDOMEN AND PELVIS WITH CONTRAST 11/19/2023 05:39:40 PM TECHNIQUE: CT of the abdomen and pelvis was performed with the administration of 100 mL of iohexol  (OMNIPAQUE ) 300 MG/ML solution. Multiplanar reformatted images are provided for review. Automated exposure control, iterative reconstruction, and/or weight-based adjustment of the mA/kV was utilized to reduce the radiation dose to as low as reasonably achievable. COMPARISON: 09/12/2023 CLINICAL HISTORY: Abdominal pain, acute, nonlocalized. Pt arrived via POV c/o recurrent pancreatitis. Pt reports he was drinking yesterday and understands this can cause him to have pancreatitis. FINDINGS: LOWER CHEST: No acute abnormality. LIVER: Hepatic steatosis. GALLBLADDER AND BILE DUCTS: Gallbladder is unremarkable. No biliary ductal dilatation. SPLEEN: No acute abnormality. PANCREAS: Mild inflammatory changes are noted around the pancreatic body and tail suggesting acute pancreatitis. No  pseudocyst formation is noted. ADRENAL GLANDS: No acute abnormality. KIDNEYS, URETERS AND BLADDER: No stones in the kidneys or ureters. No hydronephrosis. No perinephric or periureteral stranding. Urinary bladder is unremarkable. GI AND BOWEL: Stomach demonstrates no acute abnormality. There is no bowel obstruction. PERITONEUM AND RETROPERITONEUM: No ascites. No free air. VASCULATURE: Aorta is normal in caliber. LYMPH NODES: No lymphadenopathy. REPRODUCTIVE ORGANS: No acute abnormality. BONES AND SOFT TISSUES: No acute osseous abnormality. No focal soft tissue abnormality. IMPRESSION: 1. Acute pancreatitis involving the pancreatic body and tail. No pseudocyst formation. 2. Hepatic steatosis. Electronically signed by: Lynwood Seip MD 11/19/2023 05:51 PM EDT RP Workstation: [id number]        Scheduled Meds:  baclofen   10 mg Oral BID   busPIRone  7.5 mg Oral BID   enoxaparin  (LOVENOX ) injection  40 mg Subcutaneous Q24H   folic acid   1 mg Oral Daily   gabapentin   300 mg Oral TID   losartan   100 mg Oral Daily   multivitamin with minerals  1 tablet Oral Daily   pantoprazole  (PROTONIX ) IV  40 mg Intravenous Q24H   thiamine   100 mg Oral Daily  Or   thiamine   100 mg Intravenous Daily   Continuous Infusions:  dextrose  5 % and 0.9 % NaCl 125 mL/hr at 11/20/23 1025     LOS: 1 day    Time spent: 55 minutes    Ronan Dion D Maree, DO [facility]  If 7PM-7AM, please contact night-coverage [email or web address] 11/20/2023, 10:29 AM

## 2023-11-20 NOTE — Progress Notes (Signed)
 Transition of Care Department [facility]) has reviewed patient and no other TOC needs have been identified at this time. We will continue to monitor patient advancement through interdisciplinary progression rounds. If new patient transition needs arise, please place a TOC consult.    11/20/23 0750  TOC Brief Assessment  Insurance and Status Reviewed  Patient has primary care physician Yes  Home environment has been reviewed Lives with wife.  Prior level of function: Independent.  Prior/Current Home Services No current home services  Social Drivers of Health Review SDOH reviewed no interventions necessary  Readmission risk has been reviewed Yes  Transition of care needs no transition of care needs at this time

## 2023-11-21 LAB — COMPREHENSIVE METABOLIC PANEL WITH GFR
ALT: 33 U/L (ref 0–44)
AST: 27 U/L (ref 15–41)
Albumin: 3.6 g/dL (ref 3.5–5.0)
Alkaline Phosphatase: 80 U/L (ref 38–126)
Anion gap: 10 (ref 5–15)
BUN: <5 mg/dL — ABNORMAL LOW (ref 8–23)
CO2: 23 mmol/L (ref 22–32)
Calcium: 8.5 mg/dL — ABNORMAL LOW (ref 8.9–10.3)
Chloride: 103 mmol/L (ref 98–111)
Creatinine, Ser: 0.66 mg/dL (ref 0.61–1.24)
GFR, Estimated: >60 mL/min (ref >60)
Glucose, Bld: 134 mg/dL — ABNORMAL HIGH (ref 70–99)
Potassium: 3.3 mmol/L — ABNORMAL LOW (ref 3.5–5.1)
Sodium: 136 mmol/L (ref 135–145)
Total Bilirubin: 0.7 mg/dL (ref 0.0–1.2)
Total Protein: 6.1 g/dL — ABNORMAL LOW (ref 6.5–8.1)

## 2023-11-21 LAB — CBC
HCT: 38.8 % — ABNORMAL LOW (ref 39.0–52.0)
Hemoglobin: 13.5 g/dL (ref 13.0–17.0)
MCH: 33.4 pg (ref 26.0–34.0)
MCHC: 34.8 g/dL (ref 30.0–36.0)
MCV: 96 fL (ref 80.0–100.0)
Platelets: 178 K/uL (ref 150–400)
RBC: 4.04 MIL/uL — ABNORMAL LOW (ref 4.22–5.81)
RDW: 13 % (ref 11.5–15.5)
WBC: 11.2 K/uL — ABNORMAL HIGH (ref 4.0–10.5)
nRBC: 0 % (ref 0.0–0.2)

## 2023-11-21 LAB — MAGNESIUM: Magnesium: 2 mg/dL (ref 1.7–2.4)

## 2023-11-21 LAB — LIPASE, BLOOD: Lipase: 217 U/L — ABNORMAL HIGH (ref 11–51)

## 2023-11-21 NOTE — Discharge Summary (Signed)
 Physician Discharge Summary  Colin Ellis FMW:[id number] DOB: 1960-06-26 DOA: 11/19/2023  PCP: [name], MD  Admit date: 11/19/2023 Discharge date: 11/21/2023  Admitted From: Home Disposition: Home  Recommendations for Outpatient Follow-up:  Follow up with PCP in 1 week with repeat CBC/BMP Follow up in ED if symptoms worsen or new appear   Home Health: No Equipment/Devices: None  Discharge Condition: Stable CODE STATUS: Full Diet recommendation: Heart healthy  Brief/Interim Summary:  Colin Ellis is a 63 y.o. male with medical history significant for alcohol abuse, BPH, COPD, hypertension, pancreatitis. Patient presented to the ED with complaints of abdominal pain that started on the day of presentation.  Patient was admitted with acute alcoholic pancreatitis.    Pancreatitis provoked by alcohol abuse.  Lipase elevated at 2381.  CTAP W contrast-acute pancreatitis involving the pancreatic body and tail. - Managed with supportive care.  Was able to tolerate diet prior to discharge.    Alcohol abuse- Advised to stay away from alcohol.   Received multiple vitamins.  No significant withdrawal symptoms noted.  Elevated liver enzymes-AST 80, ALT 80, mildly elevated T. bili 1.4, normal ALP of 109.  Likely secondary to alcoholic liver disease, though pattern not  quite consistent.  Liver imaging shows hepatic steatosis, unremarkable gallbladder no biliary ductal dilatation.    COPD-stable. - Resume home regimen   Hypertension-stable. - Losartan   Discharge Diagnoses:  Principal Problem:   Alcohol-induced acute pancreatitis Active Problems:   COPD (chronic obstructive pulmonary disease) (HCC)   Essential hypertension    Discharge Instructions  Discharge Instructions     Call MD for:  persistant nausea and vomiting   Complete by: As directed    Call MD for:  severe uncontrolled pain   Complete by: As directed    Diet - low sodium heart healthy   Complete  by: As directed    Discharge instructions   Complete by: As directed    1.  Please stick to a low fiber/low-cholesterol diet for next 1 to 2 weeks. 2.  We advise you to stay away from alcohol to prevent recurrent pancreatitis and other health issues.   Increase activity slowly   Complete by: As directed       Allergies as of 11/21/2023       Reactions   Sulfa Antibiotics Nausea And Vomiting   Upset stomach        Medication List     TAKE these medications    B-1 100 MG Tabs Take 1 tablet (100 mg total) by mouth daily.   B-12 1000 MCG Tbcr Take 1 tablet by mouth daily.   baclofen  10 MG tablet Commonly known as: LIORESAL  TAKE 1 TABLET(10 MG) BY MOUTH TWICE DAILY   busPIRone 7.5 MG tablet Commonly known as: BUSPAR Take 1 tablet (7.5 mg total) by mouth 2 (two) times daily.   clobetasol  ointment 0.05 % Commonly known as: TEMOVATE  Apply 1 Application topically 2 (two) times daily. What changed:  when to take this reasons to take this   folic acid  1 MG tablet Commonly known as: FOLVITE  Take 1 tablet (1 mg total) by mouth daily.   gabapentin  300 MG capsule Commonly known as: NEURONTIN  Take 1 capsule (300 mg total) by mouth 3 (three) times daily.   hydrOXYzine 25 MG capsule Commonly known as: VISTARIL Take 1 capsule (25 mg total) by mouth every 8 (eight) hours as needed. What changed:  when to take this reasons to take this   losartan  100  MG tablet Commonly known as: COZAAR  Take 1 tablet (100 mg total) by mouth daily.   Melatonin 5 MG Caps Take 5 mg by mouth at bedtime.   multivitamin with minerals tablet Take 1 tablet by mouth daily.   pantoprazole  40 MG tablet Commonly known as: Protonix  Take 1 tablet (40 mg total) by mouth daily.        Follow-up Information     [name], MD. Schedule an appointment as soon as possible for a visit in 1 week(s).   Specialty: Internal Medicine Contact information: [address] [place]  [address] [phone]                Allergies  Allergen Reactions   Sulfa Antibiotics Nausea And Vomiting    Upset stomach    Consultations:    Procedures/Studies: CT ABDOMEN PELVIS W CONTRAST Result Date: 11/19/2023 EXAM: CT ABDOMEN AND PELVIS WITH CONTRAST 11/19/2023 05:39:40 PM TECHNIQUE: CT of the abdomen and pelvis was performed with the administration of 100 mL of iohexol  (OMNIPAQUE ) 300 MG/ML solution. Multiplanar reformatted images are provided for review. Automated exposure control, iterative reconstruction, and/or weight-based adjustment of the mA/kV was utilized to reduce the radiation dose to as low as reasonably achievable. COMPARISON: 09/12/2023 CLINICAL HISTORY: Abdominal pain, acute, nonlocalized. Pt arrived via POV c/o recurrent pancreatitis. Pt reports he was drinking yesterday and understands this can cause him to have pancreatitis. FINDINGS: LOWER CHEST: No acute abnormality. LIVER: Hepatic steatosis. GALLBLADDER AND BILE DUCTS: Gallbladder is unremarkable. No biliary ductal dilatation. SPLEEN: No acute abnormality. PANCREAS: Mild inflammatory changes are noted around the pancreatic body and tail suggesting acute pancreatitis. No pseudocyst formation is noted. ADRENAL GLANDS: No acute abnormality. KIDNEYS, URETERS AND BLADDER: No stones in the kidneys or ureters. No hydronephrosis. No perinephric or periureteral stranding. Urinary bladder is unremarkable. GI AND BOWEL: Stomach demonstrates no acute abnormality. There is no bowel obstruction. PERITONEUM AND RETROPERITONEUM: No ascites. No free air. VASCULATURE: Aorta is normal in caliber. LYMPH NODES: No lymphadenopathy. REPRODUCTIVE ORGANS: No acute abnormality. BONES AND SOFT TISSUES: No acute osseous abnormality. No focal soft tissue abnormality. IMPRESSION: 1. Acute pancreatitis involving the pancreatic body and tail. No pseudocyst formation. 2. Hepatic steatosis. Electronically signed by: Lynwood Seip MD 11/19/2023 05:51  PM EDT RP Workstation: [id number]      Subjective:   Discharge Exam: Vitals:   11/21/23 0800 11/21/23 1242  BP: 126/87 129/79  Pulse: 93 90  Resp:    Temp:  99.3 F (37.4 C)  SpO2:  97%    General: Pt is alert, awake, not in acute distress Cardiovascular: rate controlled, S1/S2 + Respiratory: bilateral decreased breath sounds at bases Abdominal: Soft, NT, ND, bowel sounds + Extremities: no edema, no cyanosis    The results of significant diagnostics from this hospitalization (including imaging, microbiology, ancillary and laboratory) are listed below for reference.     Microbiology: No results found for this or any previous visit (from the past 240 hours).   Labs: BNP (last 3 results) No results for input(s): BNP in the last 8760 hours. Basic Metabolic Panel: Recent Labs  Lab 11/19/23 1602 11/19/23 1945 11/20/23 0425 11/21/23 0424  NA 131*  --  136 136  K 4.0  --  3.9 3.3*  CL 90*  --  102 103  CO2 17*  --  21* 23  GLUCOSE 86  --  111* 134*  BUN 7*  --  5* <5*  CREATININE 0.73  --  0.56* 0.66  CALCIUM 9.7  --  8.3* 8.5*  MG  --  2.1  --  2.0   Liver Function Tests: Recent Labs  Lab 11/19/23 1602 11/20/23 0425 11/21/23 0424  AST 80* 41 27  ALT 80* 48* 33  ALKPHOS 109 79 80  BILITOT 1.4* 1.0 0.7  PROT 8.4* 6.1* 6.1*  ALBUMIN 5.0 3.8 3.6   Recent Labs  Lab 11/19/23 1602 11/21/23 0424  LIPASE 2,381* 217*   No results for input(s): AMMONIA in the last 168 hours. CBC: Recent Labs  Lab 11/19/23 1602 11/20/23 0425 11/21/23 0424  WBC 11.8* 10.6* 11.2*  NEUTROABS 10.1*  --   --   HGB 16.7 14.2 13.5  HCT 47.5 40.3 38.8*  MCV 94.4 94.8 96.0  PLT 250 211 178   Cardiac Enzymes: No results for input(s): CKTOTAL, CKMB, CKMBINDEX, TROPONINI in the last 168 hours. BNP: Invalid input(s): POCBNP CBG: No results for input(s): GLUCAP in the last 168 hours. D-Dimer No results for input(s): DDIMER in the last 72 hours. Hgb  A1c No results for input(s): HGBA1C in the last 72 hours. Lipid Profile No results for input(s): CHOL, HDL, LDLCALC, TRIG, CHOLHDL, LDLDIRECT in the last 72 hours. Thyroid  function studies No results for input(s): TSH, T4TOTAL, T3FREE, THYROIDAB in the last 72 hours.  Invalid input(s): FREET3 Anemia work up No results for input(s): VITAMINB12, FOLATE, FERRITIN, TIBC, IRON, RETICCTPCT in the last 72 hours. Urinalysis    Component Value Date/Time   COLORURINE STRAW (A) 11/19/2023 1857   APPEARANCEUR CLEAR 11/19/2023 1857   LABSPEC 1.018 11/19/2023 1857   PHURINE 5.0 11/19/2023 1857   GLUCOSEU NEGATIVE 11/19/2023 1857   HGBUR NEGATIVE 11/19/2023 1857   BILIRUBINUR NEGATIVE 11/19/2023 1857   KETONESUR 20 (A) 11/19/2023 1857   PROTEINUR NEGATIVE 11/19/2023 1857   NITRITE NEGATIVE 11/19/2023 1857   LEUKOCYTESUR NEGATIVE 11/19/2023 1857   Sepsis Labs Recent Labs  Lab 11/19/23 1602 11/20/23 0425 11/21/23 0424  WBC 11.8* 10.6* 11.2*   Microbiology No results found for this or any previous visit (from the past 240 hours).   Time coordinating discharge: 35 minutes  SIGNED:   Raymie Trani, MD  [facility] 11/21/2023, 5:05 PM

## 2023-11-21 NOTE — Progress Notes (Signed)
 PROGRESS NOTE    Colin Ellis  FMW:[id number] DOB: 03-06-1960 DOA: 11/19/2023 PCP: [name], MD   Brief Narrative:    Colin Ellis is a 63 y.o. male with medical history significant for alcohol abuse, BPH, COPD, hypertension, pancreatitis. Patient presented to the ED with complaints of abdominal pain that started on the day of presentation.  Patient was admitted with acute alcoholic pancreatitis.  Assessment & Plan:   Principal Problem:   Alcohol-induced acute pancreatitis Active Problems:   COPD (chronic obstructive pulmonary disease) (HCC)   Essential hypertension  Assessment and Plan:   Acute pancreatitis-abdominal pain without vomiting. Pancreatitis provoked by alcohol abuse.  Lipase elevated at 2381.  CTAP W contrast-acute pancreatitis involving the pancreatic body and tail. - Lipase is significantly improved.  Will continue pain management.  Abdominal pain has improved as well.  Will start clear liquid diet and advance as tolerated.    Alcohol abuse-last drink was today at 10 AM.  Denies history drawl, no seizure history.  Denies daily intake, but drinks 6-7 beers daily on average.  At this time, patient is calm, no sign of withdrawal. - CIWA as needed - Thiamine , folate, multivitamins - Potassium 4, check magnesium - Add-on blood alcohol level   Elevated liver enzymes-AST 80, ALT 80, mildly elevated T. bili 1.4, normal ALP of 109.  Likely secondary to alcoholic liver disease, though pattern not  quite consistent.  Liver imaging shows hepatic steatosis, unremarkable gallbladder no biliary ductal dilatation. -Trend   COPD-stable. - Resume home regimen   Hypertension-stable. - Losartan   DVT prophylaxis:Lovenox  Code Status: Full Family Communication: None at bedside Disposition Plan:  Status is: Inpatient Remains inpatient appropriate because: Need for IV medications.  Consultants:  None  Procedures:  None  Antimicrobials:   None   Subjective: Patient seen and evaluated today, reports improvement in pain.  Wants to eat. Objective: Vitals:   11/20/23 0753 11/20/23 1310 11/20/23 2002 11/21/23 0431  BP: (!) 156/87 (!) 156/99 (!) 148/88 130/86  Pulse: 78 80 89 91  Resp:   18 18  Temp: 97.7 F (36.5 C) 98.4 F (36.9 C) 99.4 F (37.4 C) 99.1 F (37.3 C)  TempSrc: Oral Oral Oral Oral  SpO2: 93% 98% 96% 95%  Weight:      Height:       No intake or output data in the 24 hours ending 11/21/23 0813 Filed Weights   11/19/23 1529 11/19/23 2225  Weight: 74.4 kg 72.7 kg    Examination:  General exam: Appears calm and comfortable  Respiratory system: Clear to auscultation. Respiratory effort normal. Cardiovascular system: S1 & S2 heard, RRR.  Gastrointestinal system: Abdomen is soft Central nervous system: Alert and awake Extremities: No edema Skin: No significant lesions noted Psychiatry: Flat affect.    Data Reviewed: I have personally reviewed following labs and imaging studies  CBC: Recent Labs  Lab 11/19/23 1602 11/20/23 0425 11/21/23 0424  WBC 11.8* 10.6* 11.2*  NEUTROABS 10.1*  --   --   HGB 16.7 14.2 13.5  HCT 47.5 40.3 38.8*  MCV 94.4 94.8 96.0  PLT 250 211 178   Basic Metabolic Panel: Recent Labs  Lab 11/19/23 1602 11/19/23 1945 11/20/23 0425 11/21/23 0424  NA 131*  --  136 136  K 4.0  --  3.9 3.3*  CL 90*  --  102 103  CO2 17*  --  21* 23  GLUCOSE 86  --  111* 134*  BUN 7*  --  5* <  5*  CREATININE 0.73  --  0.56* 0.66  CALCIUM 9.7  --  8.3* 8.5*  MG  --  2.1  --  2.0   GFR: Estimated Creatinine Clearance: 95.9 mL/min (by C-G formula based on SCr of 0.66 mg/dL). Liver Function Tests: Recent Labs  Lab 11/19/23 1602 11/20/23 0425 11/21/23 0424  AST 80* 41 27  ALT 80* 48* 33  ALKPHOS 109 79 80  BILITOT 1.4* 1.0 0.7  PROT 8.4* 6.1* 6.1*  ALBUMIN 5.0 3.8 3.6   Recent Labs  Lab 11/19/23 1602 11/21/23 0424  LIPASE 2,381* 217*   No results for input(s):  AMMONIA in the last 168 hours. Coagulation Profile: No results for input(s): INR, PROTIME in the last 168 hours. Cardiac Enzymes: No results for input(s): CKTOTAL, CKMB, CKMBINDEX, TROPONINI in the last 168 hours. BNP (last 3 results) No results for input(s): PROBNP in the last 8760 hours. HbA1C: No results for input(s): HGBA1C in the last 72 hours. CBG: No results for input(s): GLUCAP in the last 168 hours. Lipid Profile: No results for input(s): CHOL, HDL, LDLCALC, TRIG, CHOLHDL, LDLDIRECT in the last 72 hours. Thyroid  Function Tests: No results for input(s): TSH, T4TOTAL, FREET4, T3FREE, THYROIDAB in the last 72 hours. Anemia Panel: No results for input(s): VITAMINB12, FOLATE, FERRITIN, TIBC, IRON, RETICCTPCT in the last 72 hours. Sepsis Labs: Recent Labs  Lab 11/19/23 1754 11/19/23 1933  LATICACIDVEN 1.8 1.6    No results found for this or any previous visit (from the past 240 hours).       Radiology Studies: CT ABDOMEN PELVIS W CONTRAST Result Date: 11/19/2023 EXAM: CT ABDOMEN AND PELVIS WITH CONTRAST 11/19/2023 05:39:40 PM TECHNIQUE: CT of the abdomen and pelvis was performed with the administration of 100 mL of iohexol  (OMNIPAQUE ) 300 MG/ML solution. Multiplanar reformatted images are provided for review. Automated exposure control, iterative reconstruction, and/or weight-based adjustment of the mA/kV was utilized to reduce the radiation dose to as low as reasonably achievable. COMPARISON: 09/12/2023 CLINICAL HISTORY: Abdominal pain, acute, nonlocalized. Pt arrived via POV c/o recurrent pancreatitis. Pt reports he was drinking yesterday and understands this can cause him to have pancreatitis. FINDINGS: LOWER CHEST: No acute abnormality. LIVER: Hepatic steatosis. GALLBLADDER AND BILE DUCTS: Gallbladder is unremarkable. No biliary ductal dilatation. SPLEEN: No acute abnormality. PANCREAS: Mild inflammatory changes are noted  around the pancreatic body and tail suggesting acute pancreatitis. No pseudocyst formation is noted. ADRENAL GLANDS: No acute abnormality. KIDNEYS, URETERS AND BLADDER: No stones in the kidneys or ureters. No hydronephrosis. No perinephric or periureteral stranding. Urinary bladder is unremarkable. GI AND BOWEL: Stomach demonstrates no acute abnormality. There is no bowel obstruction. PERITONEUM AND RETROPERITONEUM: No ascites. No free air. VASCULATURE: Aorta is normal in caliber. LYMPH NODES: No lymphadenopathy. REPRODUCTIVE ORGANS: No acute abnormality. BONES AND SOFT TISSUES: No acute osseous abnormality. No focal soft tissue abnormality. IMPRESSION: 1. Acute pancreatitis involving the pancreatic body and tail. No pseudocyst formation. 2. Hepatic steatosis. Electronically signed by: Lynwood Seip MD 11/19/2023 05:51 PM EDT RP Workstation: [id number]        Scheduled Meds:  baclofen   10 mg Oral BID   busPIRone  7.5 mg Oral BID   enoxaparin  (LOVENOX ) injection  40 mg Subcutaneous Q24H   folic acid   1 mg Oral Daily   gabapentin   300 mg Oral TID   losartan   100 mg Oral Daily   multivitamin with minerals  1 tablet Oral Daily   nicotine  14 mg Transdermal Daily   pantoprazole  (PROTONIX ) IV  40 mg Intravenous Q24H   thiamine   100 mg Oral Daily   Or   thiamine   100 mg Intravenous Daily   Continuous Infusions:  dextrose  5 % and 0.9 % NaCl 125 mL/hr at 11/21/23 0235     LOS: 2 days    Time spent: 55 minutes    Aleesha Ringstad, DO [facility]  If 7PM-7AM, please contact night-coverage [email or web address] 11/21/2023, 8:13 AM

## 2023-11-21 NOTE — Plan of Care (Signed)

## 2023-11-23 ENCOUNTER — Other Ambulatory Visit: Payer: Self-pay | Admitting: Internal Medicine

## 2023-11-23 DIAGNOSIS — M51362 Other intervertebral disc degeneration, lumbar region with discogenic back pain and lower extremity pain: Secondary | ICD-10-CM

## 2023-11-23 DIAGNOSIS — M5030 Other cervical disc degeneration, unspecified cervical region: Secondary | ICD-10-CM

## 2023-11-24 ENCOUNTER — Encounter: Payer: Self-pay | Admitting: Radiology

## 2023-11-24 ENCOUNTER — Encounter: Payer: Self-pay | Admitting: Internal Medicine

## 2023-11-24 ENCOUNTER — Other Ambulatory Visit: Payer: Self-pay

## 2023-11-24 ENCOUNTER — Telehealth: Payer: Self-pay | Admitting: *Deleted

## 2023-11-24 ENCOUNTER — Telehealth: Payer: Self-pay

## 2023-11-24 MED ORDER — PANTOPRAZOLE SODIUM 40 MG PO TBEC: 40.0000 mg | DELAYED_RELEASE_TABLET | Freq: Every day | ORAL | 1 refills | Status: DC

## 2023-11-24 NOTE — Telephone Encounter (Signed)
 Copied from CRM #[id number]. Topic: Clinical - Medication Question >> Nov 24, 2023 11:29 AM Colin Ellis wrote: Reason for CRM: Patient called in needing his stomach pills refilled. Unable to provide name due to leaving bottle at hospital.

## 2023-11-24 NOTE — Telephone Encounter (Signed)
 Refill sent.

## 2023-11-24 NOTE — Transitions of Care (Post Inpatient/ED Visit) (Signed)
   11/24/2023  Name: Colin Ellis MRN: [id number] DOB: [birth year]  Today's TOC FU Call Status: Today's TOC FU Call Status:: Successful TOC FU Call Completed TOC FU Call Complete Date: 11/24/23 Patient's Name and Date of Birth confirmed.  Transition Care Management Follow-up Telephone Call Date of Discharge: 11/20/23 [facility] Type of Discharge: Inpatient Admission Primary Inpatient Discharge Diagnosis:: Alcohol-induced acute pancreatitis How have you been since you were released from the hospital?: Better Any questions or concerns?: No  Items Reviewed: Did you receive and understand the discharge instructions provided?: Yes Medications obtained,verified, and reconciled?: Yes (Medications Reviewed) Any new allergies since your discharge?: No Dietary orders reviewed?: Yes Type of Diet Ordered:: low fiber/low-cholesterol diet for next 1 to 2 weeks. Do you have support at home?: Yes People in Home [RPT]: spouse Name of Support/Comfort Primary Source: Katheryn  Medications Reviewed Today: Medications Reviewed Today   Medications were not reviewed in this encounter     Home Care and Equipment/Supplies: Were Home Health Services Ordered?: NA Any new equipment or medical supplies ordered?: NA  Functional Questionnaire: Do you need assistance with bathing/showering or dressing?: No Do you need assistance with meal preparation?: No Do you need assistance with eating?: No Do you have difficulty maintaining continence: No Do you need assistance with getting out of bed/getting out of a chair/moving?: No Do you have difficulty managing or taking your medications?: No  Follow up appointments reviewed: PCP Follow-up appointment confirmed?: Yes Date of PCP follow-up appointment?: 12/08/23 Follow-up Provider: Dr [facility] Follow-up appointment confirmed?: NA Do you need transportation to your follow-up appointment?: No Do you understand care  options if your condition(s) worsen?: Yes-patient verbalized understanding  SDOH Interventions Today    Flowsheet Row Most Recent Value  SDOH Interventions   Food Insecurity Interventions Intervention Not Indicated  Housing Interventions Intervention Not Indicated  Transportation Interventions Intervention Not Indicated  Utilities Interventions Intervention Not Indicated  Depression Interventions/Treatment  Medication, Currently on Treatment  Discussed and offered 30 day TOC program.  Patient declined  The patient has been provided with contact information for the care management team and has been advised to call with any health -related questions or concerns.  The patient verbalized understanding with current plan of care.  The patient is directed to their insurance card regarding availability of benefits coverage  Cathlean Headland BSN RN [facility] Care Management Coordinator [email or web address] Direct Dial: [phone]  Fax: [phone] Website: [email or web address]

## 2023-11-25 ENCOUNTER — Other Ambulatory Visit: Payer: Self-pay | Admitting: Internal Medicine

## 2023-12-07 ENCOUNTER — Encounter: Payer: Self-pay | Admitting: Internal Medicine

## 2023-12-08 ENCOUNTER — Encounter: Payer: Self-pay | Admitting: Internal Medicine

## 2023-12-08 ENCOUNTER — Ambulatory Visit: Payer: Self-pay | Admitting: Internal Medicine

## 2023-12-08 VITALS — BP 122/80 | HR 76 | Ht 71.0 in | Wt 164.0 lb

## 2023-12-08 DIAGNOSIS — K859 Acute pancreatitis without necrosis or infection, unspecified: Secondary | ICD-10-CM

## 2023-12-08 DIAGNOSIS — Z09 Encounter for follow-up examination after completed treatment for conditions other than malignant neoplasm: Secondary | ICD-10-CM

## 2023-12-08 DIAGNOSIS — R55 Syncope and collapse: Secondary | ICD-10-CM

## 2023-12-08 DIAGNOSIS — K219 Gastro-esophageal reflux disease without esophagitis: Secondary | ICD-10-CM

## 2023-12-08 DIAGNOSIS — I10 Essential (primary) hypertension: Secondary | ICD-10-CM

## 2023-12-08 MED ORDER — PANTOPRAZOLE SODIUM 40 MG PO TBEC: 40.0000 mg | DELAYED_RELEASE_TABLET | Freq: Every day | ORAL | 1 refills | Status: AC

## 2023-12-08 MED ORDER — LOSARTAN POTASSIUM 100 MG PO TABS: 50.0000 mg | ORAL_TABLET | Freq: Every day | ORAL | 1 refills | Status: AC

## 2023-12-08 NOTE — Assessment & Plan Note (Signed)
 Has longstanding history of alcohol abuse and NSAID use Recently had to take meloxicam  for acute low back pain and left hip pain, but stopped now Continue Protonix  40 mg QD, advised to remain compliant Avoid hot and spicy food

## 2023-12-08 NOTE — Assessment & Plan Note (Signed)
 BP Readings from Last 1 Encounters:  12/08/23 122/80   Usually well-controlled with losartan  100 mg QD Due to concern for orthostatic hypotension, decreased dose of losartan  to 50 mg QD for now Needs to cut down on alcohol use and quit smoking Counseled for compliance with the medications Advised DASH diet and moderate exercise/walking, at least 150 mins/week

## 2023-12-08 NOTE — Assessment & Plan Note (Signed)
 Hospital chart reviewed, including discharge summary Medications reconciled and reviewed with the patient in detail

## 2023-12-08 NOTE — Assessment & Plan Note (Addendum)
 His episode of fall  yesterday was likely due to vasovagal syncope, in the setting of orthostatic hypotension due to dehydration Advised to maintain at least 64 ounces of water  intake in a day and eat at regular intervals Reduced dose of losartan  to 50 mg QD for now Avoid sudden positional changes Needs to avoid alcohol use

## 2023-12-08 NOTE — Assessment & Plan Note (Signed)
 Likely related to alcohol abuse Have had discussion to avoid alcohol use in the past due to pancreatitis He reports that he has cut back significantly and tries to avoid daily alcohol intake, but still drinks beers on some days Advised to maintain adequate hydration Continue thiamine  and folic acid  supplements

## 2023-12-08 NOTE — Patient Instructions (Signed)
 Please start taking Losartan  half tablet once daily.  Please take Pantoprazole  40 mg once daily regularly for acid reflux/abdominal pain.  Please maintain at least 64 ounces of water  intake in a day.

## 2023-12-08 NOTE — Progress Notes (Signed)
 Established Patient Office Visit  Subjective:  Patient ID: Colin Ellis, male    DOB: 1961-01-17  Age: 63 y.o. MRN: [id number]  CC:  Chief Complaint  Patient presents with   Hospitalization Follow-up    Hospital f/u     HPI Colin Ellis is a 63 y.o. male with past medical history of HTN, COPD, BPH, pancreatitis and DDD of lumbar spine who presents for follow-up after recent hospitalization from 11/19/23-11/21/23.  He presented with abdominal pain that was in his epigastric region and radiated to his left upper quadrant/back.  CT abdomen pelvis showed acute pancreatitis and hepatic steatosis.  He was admitted for further management.  He was given IV fluids and Protonix .  Diet was gradually advanced to solid food.  His lipase level and liver enzymes trended down during the hospitalization.  Today, he reports improvement in abdominal pain, nausea and vomiting.  He has tried to avoid alcohol use, last intake about 3 days ago - had 2 beers, but reports that he is trying to cut back and does not drink daily.  His BP is WNL today.  Vasovagal syncope: He reports having fall yesterday.  He got up from the sitting position from the recliner chair, walked few steps and fell in his kitchen.  He had slight lightheadedness prior to the fall.  Of note, he had not eaten for about 10 hours prior to the fall.  He currently takes losartan  100 mg QD for HTN.  Past Medical History:  Diagnosis Date   ADH disorder    Arthritis    Electrical injury in adult    Electrician by trade, several shocks during working career    Psoriasis    Teeth missing due to trauma     Past Surgical History:  Procedure Laterality Date   BACK SURGERY     herniated disc   COLONOSCOPY  11/12/2010   Surgeon: Lamar CHRISTELLA Hollingshead, MD;  pancolonic diverticulosis, otherwise normal exam.   COLONOSCOPY WITH PROPOFOL  N/A 07/09/2021   Surgeon: Hollingshead Lamar CHRISTELLA, MD; pancolonic diverticulosis, redundant colon, otherwise normal exam.   Recommended 10-year screening.    Family History  Problem Relation Age of Onset   Colon cancer Paternal Grandfather        8s or 35s   Anesthesia problems Neg Hx    Hypotension Neg Hx    Malignant hyperthermia Neg Hx    Pseudochol deficiency Neg Hx     Social History   Socioeconomic History   Marital status: Married    Spouse name: Not on file   Number of children: Not on file   Years of education: Not on file   Highest education level: GED or equivalent  Occupational History   Not on file  Tobacco Use   Smoking status: Every Day    Current packs/day: 1.00    Average packs/day: 1 pack/day for 40.0 years (40.0 ttl pk-yrs)    Types: Cigarettes    Passive exposure: Current   Smokeless tobacco: Never   Tobacco comments:    13 cigarettes a day as of 06/30/23  Vaping Use   Vaping status: Never Used  Substance and Sexual Activity   Alcohol use: Yes    Alcohol/week: 4.0 standard drinks of alcohol    Types: 4 Cans of beer per week    Comment: 3-4 beers a night   Drug use: No   Sexual activity: Not on file  Other Topics Concern   Not on file  Social History  Narrative   Not on file   Social Drivers of Health   Financial Resource Strain: Medium Risk (07/22/2023)   Overall Financial Resource Strain (CARDIA)    Difficulty of Paying Living Expenses: Somewhat hard  Food Insecurity: No Food Insecurity (11/24/2023)   Hunger Vital Sign    Worried About Running Out of Food in the Last Year: Never true    Ran Out of Food in the Last Year: Never true  Transportation Needs: No Transportation Needs (11/24/2023)   PRAPARE - Administrator, Civil Service (Medical): No    Lack of Transportation (Non-Medical): No  Physical Activity: Inactive (07/22/2023)   Exercise Vital Sign    Days of Exercise per Week: 0 days    Minutes of Exercise per Session: Not on file  Stress: Stress Concern Present (07/22/2023)   [employer] of Occupational Health - Occupational Stress  Questionnaire    Feeling of Stress: To some extent  Social Connections: Unknown (07/22/2023)   Social Connection and Isolation Panel    Frequency of Communication with Friends and Family: Once a week    Frequency of Social Gatherings with Friends and Family: Patient declined    Attends Religious Services: Never    Database Administrator or Organizations: No    Attends Engineer, Structural: Not on file    Marital Status: Married  Catering Manager Violence: Not At Risk (11/24/2023)   Humiliation, Afraid, Rape, and Kick questionnaire    Fear of Current or Ex-Partner: No    Emotionally Abused: No    Physically Abused: No    Sexually Abused: No    Outpatient Medications Prior to Visit  Medication Sig Dispense Refill   baclofen  (LIORESAL ) 10 MG tablet TAKE 1 TABLET(10 MG) BY MOUTH TWICE DAILY 60 tablet 0   busPIRone (BUSPAR) 7.5 MG tablet Take 1 tablet (7.5 mg total) by mouth 2 (two) times daily. 60 tablet 3   clobetasol  ointment (TEMOVATE ) 0.05 % Apply 1 Application topically 2 (two) times daily. (Patient taking differently: Apply 1 Application topically as needed (pso).) 60 g 1   Cyanocobalamin (B-12) 1000 MCG TBCR Take 1 tablet by mouth daily. 30 tablet 5   folic acid  (FOLVITE ) 1 MG tablet Take 1 tablet (1 mg total) by mouth daily. 30 tablet 5   gabapentin  (NEURONTIN ) 300 MG capsule Take 1 capsule (300 mg total) by mouth 3 (three) times daily. 90 capsule 5   hydrOXYzine (VISTARIL) 25 MG capsule Take 1 capsule (25 mg total) by mouth every 8 (eight) hours as needed. (Patient taking differently: Take 25 mg by mouth at bedtime as needed (sleep).) 90 capsule 1   Melatonin 5 MG CAPS Take 5 mg by mouth at bedtime.     Multiple Vitamins-Minerals (MULTIVITAMIN WITH MINERALS) tablet Take 1 tablet by mouth daily. 120 tablet 2   Thiamine  HCl (B-1) 100 MG TABS Take 1 tablet (100 mg total) by mouth daily. 30 tablet 5   losartan  (COZAAR ) 100 MG tablet Take 1 tablet (100 mg total) by mouth daily. 90  tablet 1   pantoprazole  (PROTONIX ) 40 MG tablet TAKE 1 TABLET(40 MG) BY MOUTH DAILY 90 tablet 0   No facility-administered medications prior to visit.    Allergies  Allergen Reactions   Sulfa Antibiotics Nausea And Vomiting    Upset stomach    ROS Review of Systems  Constitutional:  Positive for fatigue. Negative for chills and fever.  HENT:  Negative for congestion, sinus pressure and sinus pain.  Eyes:  Negative for pain and discharge.  Respiratory:  Negative for cough and shortness of breath.   Cardiovascular:  Negative for chest pain and palpitations.  Gastrointestinal:  Negative for diarrhea, nausea and vomiting.  Genitourinary:  Negative for dysuria and hematuria.  Musculoskeletal:  Positive for arthralgias (Left clavicular and shoulder pain), back pain, gait problem and neck pain. Negative for neck stiffness.  Skin:  Positive for rash.  Neurological:  Positive for light-headedness. Negative for weakness.  Psychiatric/Behavioral:  Positive for decreased concentration. Negative for agitation and confusion. The patient is nervous/anxious.       Objective:    Physical Exam Constitutional:      General: He is not in acute distress.    Appearance: He is not diaphoretic.  HENT:     Head: Normocephalic.     Nose: No congestion.     Mouth/Throat:     Mouth: Mucous membranes are moist.     Pharynx: No posterior oropharyngeal erythema.  Eyes:     General: No scleral icterus.    Extraocular Movements: Extraocular movements intact.  Cardiovascular:     Rate and Rhythm: Normal rate and regular rhythm.     Heart sounds: Normal heart sounds. No murmur heard. Pulmonary:     Breath sounds: Normal breath sounds. No wheezing or rales.  Musculoskeletal:     Left shoulder: No swelling. Normal range of motion.     Cervical back: Neck supple. Tenderness present.     Lumbar back: Tenderness present. Decreased range of motion. Positive right straight leg raise test and positive left  straight leg raise test.     Right lower leg: No edema.     Left lower leg: No edema.     Comments: Left mid clavicular area tenderness  Skin:    General: Skin is warm.     Findings: Rash (Erythematous patches with silvery scaling over bilateral shin) present.  Neurological:     General: No focal deficit present.     Mental Status: He is alert and oriented to person, place, and time.     Sensory: No sensory deficit.     Motor: No weakness.  Psychiatric:        Mood and Affect: Mood is anxious.        Behavior: Behavior is cooperative.     BP 122/80   Pulse 76   Ht 5' 11 (1.803 m)   Wt 164 lb (74.4 kg)   SpO2 94%   BMI 22.87 kg/m  Wt Readings from Last 3 Encounters:  12/08/23 164 lb (74.4 kg)  11/19/23 160 lb 4.4 oz (72.7 kg)  11/06/23 164 lb (74.4 kg)    Lab Results  Component Value Date   TSH 1.680 05/17/2022   Lab Results  Component Value Date   WBC 11.2 (H) 11/21/2023   HGB 13.5 11/21/2023   HCT 38.8 (L) 11/21/2023   MCV 96.0 11/21/2023   PLT 178 11/21/2023   Lab Results  Component Value Date   NA 136 11/21/2023   K 3.3 (L) 11/21/2023   CO2 23 11/21/2023   GLUCOSE 134 (H) 11/21/2023   BUN <5 (L) 11/21/2023   CREATININE 0.66 11/21/2023   BILITOT 0.7 11/21/2023   ALKPHOS 80 11/21/2023   AST 27 11/21/2023   ALT 33 11/21/2023   PROT 6.1 (L) 11/21/2023   ALBUMIN 3.6 11/21/2023   CALCIUM 8.5 (L) 11/21/2023   ANIONGAP 10 11/21/2023   EGFR 100 09/25/2023   Lab Results  Component Value  Date   CHOL 153 05/17/2022   Lab Results  Component Value Date   HDL 83 05/17/2022   Lab Results  Component Value Date   LDLCALC 48 05/17/2022   Lab Results  Component Value Date   TRIG 128 05/17/2022   Lab Results  Component Value Date   CHOLHDL 1.8 05/17/2022   Lab Results  Component Value Date   HGBA1C 5.1 05/17/2022      Assessment & Plan:   Problem List Items Addressed This Visit       Cardiovascular and Mediastinum   Essential hypertension    BP Readings from Last 1 Encounters:  12/08/23 122/80   Usually well-controlled with losartan  100 mg QD Due to concern for orthostatic hypotension, decreased dose of losartan  to 50 mg QD for now Needs to cut down on alcohol use and quit smoking Counseled for compliance with the medications Advised DASH diet and moderate exercise/walking, at least 150 mins/week      Relevant Medications   losartan  (COZAAR ) 100 MG tablet     Digestive   Gastroesophageal reflux disease   Has longstanding history of alcohol abuse and NSAID use Recently had to take meloxicam  for acute low back pain and left hip pain, but stopped now Continue Protonix  40 mg QD, advised to remain compliant Avoid hot and spicy food      Relevant Medications   pantoprazole  (PROTONIX ) 40 MG tablet   Acute recurrent pancreatitis - Primary   Likely related to alcohol abuse Have had discussion to avoid alcohol use in the past due to pancreatitis He reports that he has cut back significantly and tries to avoid daily alcohol intake, but still drinks beers on some days Advised to maintain adequate hydration Continue thiamine  and folic acid  supplements      Relevant Medications   pantoprazole  (PROTONIX ) 40 MG tablet     Other   Hospital discharge follow-up   Hospital chart reviewed, including discharge summary Medications reconciled and reviewed with the patient in detail      Vasovagal syncope   His episode of fall  yesterday was likely due to vasovagal syncope, in the setting of orthostatic hypotension due to dehydration Advised to maintain at least 64 ounces of water  intake in a day and eat at regular intervals Reduced dose of losartan  to 50 mg QD for now Avoid sudden positional changes Needs to avoid alcohol use        Meds ordered this encounter  Medications   losartan  (COZAAR ) 100 MG tablet    Sig: Take 0.5 tablets (50 mg total) by mouth daily.    Dispense:  45 tablet    Refill:  1   pantoprazole   (PROTONIX ) 40 MG tablet    Sig: Take 1 tablet (40 mg total) by mouth daily.    Dispense:  90 tablet    Refill:  1    **Patient requests 90 days supply**    Follow-up: Return if symptoms worsen or fail to improve.    Suzzane MARLA Blanch, MD

## 2024-02-22 ENCOUNTER — Other Ambulatory Visit: Payer: Self-pay | Admitting: Internal Medicine

## 2024-02-22 DIAGNOSIS — F411 Generalized anxiety disorder: Secondary | ICD-10-CM

## 2024-02-26 ENCOUNTER — Ambulatory Visit: Admitting: Internal Medicine

## 2024-03-18 ENCOUNTER — Ambulatory Visit: Admitting: Neurology
